# Patient Record
Sex: Female | Born: 1985 | Race: White | Hispanic: No
Health system: Midwestern US, Community
[De-identification: ages and names within clinical notes are randomized; demographics above are authoritative.]

## PROBLEM LIST (undated history)

## (undated) DIAGNOSIS — F1910 Other psychoactive substance abuse, uncomplicated: Secondary | ICD-10-CM

## (undated) DIAGNOSIS — F1410 Cocaine abuse, uncomplicated: Secondary | ICD-10-CM

## (undated) DIAGNOSIS — F32A Depression, unspecified: Secondary | ICD-10-CM

## (undated) DIAGNOSIS — Z5900 Homelessness unspecified: Secondary | ICD-10-CM

## (undated) DIAGNOSIS — I10 Essential (primary) hypertension: Secondary | ICD-10-CM

## (undated) DIAGNOSIS — G8929 Other chronic pain: Secondary | ICD-10-CM

## (undated) DIAGNOSIS — Z765 Malingerer [conscious simulation]: Secondary | ICD-10-CM

## (undated) DIAGNOSIS — J45909 Unspecified asthma, uncomplicated: Secondary | ICD-10-CM

## (undated) DIAGNOSIS — K219 Gastro-esophageal reflux disease without esophagitis: Secondary | ICD-10-CM

## (undated) DIAGNOSIS — N809 Endometriosis, unspecified: Secondary | ICD-10-CM

## (undated) DIAGNOSIS — F329 Major depressive disorder, single episode, unspecified: Secondary | ICD-10-CM

## (undated) DIAGNOSIS — R109 Unspecified abdominal pain: Secondary | ICD-10-CM

## (undated) HISTORY — PX: TONSILLECTOMY: SUR1361

---
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
## 2003-04-30 ENCOUNTER — Emergency Department (HOSPITAL_COMMUNITY): Admission: AD | Admit: 2003-04-30 | Discharge: 2003-04-30 | Payer: Self-pay | Admitting: Family Medicine

## 2003-08-17 ENCOUNTER — Ambulatory Visit (HOSPITAL_COMMUNITY): Admission: RE | Admit: 2003-08-17 | Discharge: 2003-08-17 | Payer: Self-pay | Admitting: Family Medicine

## 2003-09-08 ENCOUNTER — Other Ambulatory Visit: Admission: RE | Admit: 2003-09-08 | Discharge: 2003-09-08 | Payer: Self-pay | Admitting: Obstetrics and Gynecology

## 2004-03-05 ENCOUNTER — Inpatient Hospital Stay (HOSPITAL_COMMUNITY): Admission: AD | Admit: 2004-03-05 | Discharge: 2004-03-05 | Payer: Self-pay | Admitting: Obstetrics and Gynecology

## 2004-03-23 ENCOUNTER — Inpatient Hospital Stay (HOSPITAL_COMMUNITY): Admission: AD | Admit: 2004-03-23 | Discharge: 2004-03-26 | Payer: Self-pay | Admitting: Obstetrics and Gynecology

## 2004-04-28 ENCOUNTER — Other Ambulatory Visit: Admission: RE | Admit: 2004-04-28 | Discharge: 2004-04-28 | Payer: Self-pay | Admitting: Obstetrics and Gynecology

## 2006-08-18 ENCOUNTER — Inpatient Hospital Stay (HOSPITAL_COMMUNITY): Admission: AD | Admit: 2006-08-18 | Discharge: 2006-08-20 | Payer: Self-pay | Admitting: Obstetrics and Gynecology

## 2006-10-12 ENCOUNTER — Ambulatory Visit (HOSPITAL_COMMUNITY): Admission: RE | Admit: 2006-10-12 | Discharge: 2006-10-12 | Payer: Self-pay | Admitting: Obstetrics and Gynecology

## 2007-06-28 ENCOUNTER — Emergency Department (HOSPITAL_COMMUNITY): Admission: EM | Admit: 2007-06-28 | Discharge: 2007-06-28 | Payer: Self-pay | Admitting: Family Medicine

## 2007-07-11 HISTORY — PX: PARTIAL HYSTERECTOMY: SHX80

## 2008-01-01 ENCOUNTER — Emergency Department (HOSPITAL_COMMUNITY): Admission: EM | Admit: 2008-01-01 | Discharge: 2008-01-01 | Payer: Self-pay | Admitting: Emergency Medicine

## 2008-07-21 ENCOUNTER — Emergency Department (HOSPITAL_COMMUNITY): Admission: EM | Admit: 2008-07-21 | Discharge: 2008-07-21 | Payer: Self-pay | Admitting: Family Medicine

## 2008-08-10 LAB — CONVERTED CEMR LAB: Pap Smear: NORMAL

## 2008-08-25 ENCOUNTER — Ambulatory Visit (HOSPITAL_COMMUNITY): Admission: RE | Admit: 2008-08-25 | Discharge: 2008-08-25 | Payer: Self-pay | Admitting: Family Medicine

## 2008-09-25 ENCOUNTER — Encounter: Admission: RE | Admit: 2008-09-25 | Discharge: 2008-09-25 | Payer: Self-pay | Admitting: Obstetrics and Gynecology

## 2008-11-02 ENCOUNTER — Inpatient Hospital Stay (HOSPITAL_COMMUNITY): Admission: AD | Admit: 2008-11-02 | Discharge: 2008-11-02 | Payer: Self-pay | Admitting: Obstetrics and Gynecology

## 2008-11-03 ENCOUNTER — Inpatient Hospital Stay (HOSPITAL_COMMUNITY): Admission: EM | Admit: 2008-11-03 | Discharge: 2008-11-07 | Payer: Self-pay | Admitting: Emergency Medicine

## 2008-11-06 ENCOUNTER — Ambulatory Visit: Payer: Self-pay | Admitting: Gastroenterology

## 2008-11-14 ENCOUNTER — Inpatient Hospital Stay (HOSPITAL_COMMUNITY): Admission: AD | Admit: 2008-11-14 | Discharge: 2008-11-16 | Payer: Self-pay | Admitting: Obstetrics & Gynecology

## 2008-11-16 ENCOUNTER — Encounter: Payer: Self-pay | Admitting: Internal Medicine

## 2008-11-21 ENCOUNTER — Ambulatory Visit (HOSPITAL_COMMUNITY): Admission: AD | Admit: 2008-11-21 | Discharge: 2008-11-23 | Payer: Self-pay | Admitting: Obstetrics and Gynecology

## 2008-11-23 ENCOUNTER — Encounter (INDEPENDENT_AMBULATORY_CARE_PROVIDER_SITE_OTHER): Payer: Self-pay | Admitting: Obstetrics and Gynecology

## 2008-11-23 ENCOUNTER — Encounter (HOSPITAL_COMMUNITY): Payer: Self-pay | Admitting: Obstetrics and Gynecology

## 2008-12-18 ENCOUNTER — Ambulatory Visit (HOSPITAL_BASED_OUTPATIENT_CLINIC_OR_DEPARTMENT_OTHER): Admission: RE | Admit: 2008-12-18 | Discharge: 2008-12-18 | Payer: Self-pay | Admitting: Urology

## 2008-12-25 ENCOUNTER — Encounter: Admission: RE | Admit: 2008-12-25 | Discharge: 2008-12-25 | Payer: Self-pay | Admitting: General Surgery

## 2008-12-28 ENCOUNTER — Inpatient Hospital Stay (HOSPITAL_COMMUNITY): Admission: AD | Admit: 2008-12-28 | Discharge: 2008-12-28 | Payer: Self-pay | Admitting: Obstetrics and Gynecology

## 2009-04-22 ENCOUNTER — Encounter: Payer: Self-pay | Admitting: Family Medicine

## 2009-05-05 ENCOUNTER — Ambulatory Visit: Payer: Self-pay | Admitting: Family Medicine

## 2009-05-05 DIAGNOSIS — F329 Major depressive disorder, single episode, unspecified: Secondary | ICD-10-CM

## 2009-05-05 DIAGNOSIS — K219 Gastro-esophageal reflux disease without esophagitis: Secondary | ICD-10-CM | POA: Insufficient documentation

## 2009-05-05 DIAGNOSIS — F3289 Other specified depressive episodes: Secondary | ICD-10-CM | POA: Insufficient documentation

## 2009-05-05 DIAGNOSIS — R109 Unspecified abdominal pain: Secondary | ICD-10-CM | POA: Insufficient documentation

## 2009-05-05 DIAGNOSIS — I10 Essential (primary) hypertension: Secondary | ICD-10-CM | POA: Insufficient documentation

## 2009-05-07 ENCOUNTER — Telehealth: Payer: Self-pay | Admitting: Family Medicine

## 2009-05-17 ENCOUNTER — Emergency Department (HOSPITAL_COMMUNITY): Admission: EM | Admit: 2009-05-17 | Discharge: 2009-05-17 | Payer: Self-pay | Admitting: Emergency Medicine

## 2009-05-19 ENCOUNTER — Telehealth: Payer: Self-pay | Admitting: Family Medicine

## 2009-05-25 ENCOUNTER — Telehealth: Payer: Self-pay | Admitting: Family Medicine

## 2010-08-11 NOTE — Letter (Signed)
Summary: [facility]   Imported By: Maryln Gottron 01/05/2010 14:14:21  _____________________________________________________________________  External Attachment:    Type:   Image     Comment:   External Document

## 2010-10-12 LAB — POCT URINALYSIS DIP (DEVICE)
Nitrite: NEGATIVE
Protein, ur: NEGATIVE mg/dL
Urobilinogen, UA: 0.2 mg/dL (ref 0.0–1.0)
pH: 6 (ref 5.0–8.0)

## 2010-10-12 LAB — POCT PREGNANCY, URINE: Preg Test, Ur: NEGATIVE

## 2010-10-17 LAB — CBC
HCT: 34 % — ABNORMAL LOW (ref 36.0–46.0)
Hemoglobin: 11.7 g/dL — ABNORMAL LOW (ref 12.0–15.0)
MCHC: 34.5 g/dL (ref 30.0–36.0)
MCV: 92.8 fL (ref 78.0–100.0)
Platelets: 158 K/uL (ref 150–400)
RBC: 3.67 MIL/uL — ABNORMAL LOW (ref 3.87–5.11)
RDW: 12.8 % (ref 11.5–15.5)
WBC: 10.1 K/uL (ref 4.0–10.5)

## 2010-10-17 LAB — COMPREHENSIVE METABOLIC PANEL
ALT: 13 U/L (ref 0–35)
Albumin: 3.4 g/dL — ABNORMAL LOW (ref 3.5–5.2)
Alkaline Phosphatase: 53 U/L (ref 39–117)
Chloride: 107 mEq/L (ref 96–112)
Glucose, Bld: 102 mg/dL — ABNORMAL HIGH (ref 70–99)
Potassium: 4.5 mEq/L (ref 3.5–5.1)
Sodium: 139 mEq/L (ref 135–145)
Total Bilirubin: 0.7 mg/dL (ref 0.3–1.2)
Total Protein: 5.4 g/dL — ABNORMAL LOW (ref 6.0–8.3)

## 2010-10-17 LAB — GC/CHLAMYDIA PROBE AMP, GENITAL
Chlamydia, DNA Probe: NEGATIVE
GC Probe Amp, Genital: NEGATIVE

## 2010-10-17 LAB — URINALYSIS, ROUTINE W REFLEX MICROSCOPIC
Bilirubin Urine: NEGATIVE
Glucose, UA: NEGATIVE mg/dL
Hgb urine dipstick: NEGATIVE
Ketones, ur: NEGATIVE mg/dL
Nitrite: NEGATIVE
Protein, ur: NEGATIVE mg/dL
Specific Gravity, Urine: 1.025 (ref 1.005–1.030)
Urobilinogen, UA: 0.2 mg/dL (ref 0.0–1.0)
pH: 6 (ref 5.0–8.0)

## 2010-10-17 LAB — WET PREP, GENITAL
Clue Cells Wet Prep HPF POC: NONE SEEN
Trich, Wet Prep: NONE SEEN

## 2010-10-17 LAB — DIFFERENTIAL
Basophils Absolute: 0 K/uL (ref 0.0–0.1)
Basophils Relative: 0 % (ref 0–1)
Eosinophils Absolute: 0.1 K/uL (ref 0.0–0.7)
Eosinophils Relative: 1 % (ref 0–5)
Lymphocytes Relative: 25 % (ref 12–46)
Lymphs Abs: 2.5 K/uL (ref 0.7–4.0)
Monocytes Absolute: 0.9 K/uL (ref 0.1–1.0)
Monocytes Relative: 9 % (ref 3–12)
Neutro Abs: 6.6 K/uL (ref 1.7–7.7)
Neutrophils Relative %: 65 % (ref 43–77)

## 2010-10-17 LAB — POCT PREGNANCY, URINE: Preg Test, Ur: NEGATIVE

## 2010-10-17 LAB — POCT HEMOGLOBIN-HEMACUE: Hemoglobin: 14.3 g/dL (ref 12.0–15.0)

## 2010-10-18 LAB — GLUCOSE, CAPILLARY: Glucose-Capillary: 53 mg/dL — ABNORMAL LOW (ref 70–99)

## 2010-10-18 LAB — COMPREHENSIVE METABOLIC PANEL
ALT: 10 U/L (ref 0–35)
AST: 18 U/L (ref 0–37)
AST: 24 U/L (ref 0–37)
Albumin: 4.3 g/dL (ref 3.5–5.2)
BUN: 6 mg/dL (ref 6–23)
CO2: 28 mEq/L (ref 19–32)
Calcium: 8.5 mg/dL (ref 8.4–10.5)
Calcium: 9.4 mg/dL (ref 8.4–10.5)
Chloride: 99 mEq/L (ref 96–112)
Creatinine, Ser: 0.6 mg/dL (ref 0.4–1.2)
GFR calc Af Amer: >60 mL/min (ref >60)
GFR calc Af Amer: >60 mL/min (ref >60)
GFR calc non Af Amer: >60 mL/min (ref >60)
GFR calc non Af Amer: >60 mL/min (ref >60)
Sodium: 135 mEq/L (ref 135–145)

## 2010-10-18 LAB — CBC
HCT: 42.3 % (ref 36.0–46.0)
MCHC: 34.4 g/dL (ref 30.0–36.0)
MCHC: 35.2 g/dL (ref 30.0–36.0)
MCV: 93.4 fL (ref 78.0–100.0)
Platelets: 265 10*3/uL (ref 150–400)
RBC: 3.66 MIL/uL — ABNORMAL LOW (ref 3.87–5.11)
WBC: 8.2 10*3/uL (ref 4.0–10.5)

## 2010-10-18 LAB — URINALYSIS, ROUTINE W REFLEX MICROSCOPIC
Bilirubin Urine: NEGATIVE
Glucose, UA: 500 mg/dL — AB
Glucose, UA: NEGATIVE mg/dL
Hgb urine dipstick: NEGATIVE
Ketones, ur: NEGATIVE mg/dL
Ketones, ur: NEGATIVE mg/dL
Leukocytes, UA: NEGATIVE
Nitrite: NEGATIVE
Nitrite: NEGATIVE
Protein, ur: NEGATIVE mg/dL
Urobilinogen, UA: 0.2 mg/dL (ref 0.0–1.0)
pH: 7 (ref 5.0–8.0)

## 2010-10-18 LAB — BASIC METABOLIC PANEL
BUN: 4 mg/dL — ABNORMAL LOW (ref 6–23)
BUN: <1 mg/dL — ABNORMAL LOW (ref 6–23)
CO2: 25 mEq/L (ref 19–32)
CO2: 30 mEq/L (ref 19–32)
Calcium: 8.7 mg/dL (ref 8.4–10.5)
Chloride: 106 mEq/L (ref 96–112)
Creatinine, Ser: 0.5 mg/dL (ref 0.4–1.2)
Creatinine, Ser: 0.45 mg/dL (ref 0.4–1.2)
Glucose, Bld: 84 mg/dL (ref 70–99)

## 2010-10-18 LAB — URINE MICROSCOPIC-ADD ON

## 2010-10-18 LAB — SEDIMENTATION RATE: Sed Rate: 13 mm/hr (ref 0–22)

## 2010-10-18 LAB — HCG, SERUM, QUALITATIVE: Preg, Serum: NEGATIVE

## 2010-10-18 LAB — DIFFERENTIAL
Basophils Absolute: 0.1 10*3/uL (ref 0.0–0.1)
Eosinophils Relative: 0 % (ref 0–5)
Lymphocytes Relative: 15 % (ref 12–46)
Lymphs Abs: 1.5 10*3/uL (ref 0.7–4.0)
Monocytes Absolute: 0.4 10*3/uL (ref 0.1–1.0)
Neutro Abs: 8.3 10*3/uL — ABNORMAL HIGH (ref 1.7–7.7)

## 2010-10-18 LAB — HEMOGLOBIN A1C
Hgb A1c MFr Bld: 5.2 % (ref 4.6–6.1)
Mean Plasma Glucose: 103 mg/dL

## 2010-10-18 LAB — LIPASE, BLOOD: Lipase: 23 U/L (ref 11–59)

## 2010-10-19 LAB — BASIC METABOLIC PANEL
BUN: 7 mg/dL (ref 6–23)
BUN: 8 mg/dL (ref 6–23)
CO2: 25 mEq/L (ref 19–32)
Calcium: 8.3 mg/dL — ABNORMAL LOW (ref 8.4–10.5)
Chloride: 102 mEq/L (ref 96–112)
Chloride: 108 mEq/L (ref 96–112)
GFR calc Af Amer: >60 mL/min (ref >60)
GFR calc non Af Amer: >60 mL/min (ref >60)
Glucose, Bld: 105 mg/dL — ABNORMAL HIGH (ref 70–99)
Glucose, Bld: 82 mg/dL (ref 70–99)
Potassium: 3 mEq/L — ABNORMAL LOW (ref 3.5–5.1)
Sodium: 136 mEq/L (ref 135–145)
Sodium: 140 mEq/L (ref 135–145)

## 2010-10-19 LAB — CBC
HCT: 42.1 % (ref 36.0–46.0)
Hemoglobin: 11.3 g/dL — ABNORMAL LOW (ref 12.0–15.0)
Hemoglobin: 11.8 g/dL — ABNORMAL LOW (ref 12.0–15.0)
Hemoglobin: 14.3 g/dL (ref 12.0–15.0)
MCHC: 33.9 g/dL (ref 30.0–36.0)
MCHC: 34.7 g/dL (ref 30.0–36.0)
MCV: 92.1 fL (ref 78.0–100.0)
Platelets: 144 10*3/uL — ABNORMAL LOW (ref 150–400)
RBC: 3.53 MIL/uL — ABNORMAL LOW (ref 3.87–5.11)
RBC: 4.6 MIL/uL (ref 3.87–5.11)
RDW: 12.4 % (ref 11.5–15.5)
RDW: 12.5 % (ref 11.5–15.5)

## 2010-10-19 LAB — COMPREHENSIVE METABOLIC PANEL
ALT: 18 U/L (ref 0–35)
Alkaline Phosphatase: 71 U/L (ref 39–117)
BUN: 13 mg/dL (ref 6–23)
CO2: 22 mEq/L (ref 19–32)
Calcium: 9.9 mg/dL (ref 8.4–10.5)
GFR calc non Af Amer: >60 mL/min (ref >60)
Glucose, Bld: 142 mg/dL — ABNORMAL HIGH (ref 70–99)
Sodium: 139 mEq/L (ref 135–145)

## 2010-10-19 LAB — HEPATIC FUNCTION PANEL
AST: 15 U/L (ref 0–37)
Albumin: 3.5 g/dL (ref 3.5–5.2)

## 2010-10-19 LAB — DIFFERENTIAL
Basophils Relative: 1 % (ref 0–1)
Eosinophils Absolute: 0 10*3/uL (ref 0.0–0.7)
Eosinophils Relative: 0 % (ref 0–5)
Monocytes Absolute: 0.5 10*3/uL (ref 0.1–1.0)
Monocytes Relative: 4 % (ref 3–12)
Neutro Abs: 12 10*3/uL — ABNORMAL HIGH (ref 1.7–7.7)

## 2010-10-19 LAB — URINALYSIS, ROUTINE W REFLEX MICROSCOPIC
Glucose, UA: NEGATIVE mg/dL
Hgb urine dipstick: NEGATIVE
Hgb urine dipstick: NEGATIVE
Ketones, ur: 40 mg/dL — AB
Nitrite: NEGATIVE
Nitrite: NEGATIVE
Specific Gravity, Urine: 1.027 (ref 1.005–1.030)
Specific Gravity, Urine: 1.02 (ref 1.005–1.030)
pH: 8 (ref 5.0–8.0)
pH: 6 (ref 5.0–8.0)

## 2010-10-19 LAB — WET PREP, GENITAL: Trich, Wet Prep: NONE SEEN

## 2010-10-19 LAB — URINE MICROSCOPIC-ADD ON

## 2010-10-19 LAB — URINE CULTURE

## 2010-10-19 LAB — GC/CHLAMYDIA PROBE AMP, GENITAL
Chlamydia, DNA Probe: NEGATIVE
GC Probe Amp, Genital: NEGATIVE

## 2010-10-19 LAB — RPR: RPR Ser Ql: NONREACTIVE

## 2010-10-19 LAB — POCT PREGNANCY, URINE: Preg Test, Ur: NEGATIVE

## 2010-10-19 LAB — MAGNESIUM: Magnesium: 1.8 mg/dL (ref 1.5–2.5)

## 2010-11-22 NOTE — Op Note (Signed)
[name], [name]              ACCOUNT NO.:  [id number]   MEDICAL RECORD NO.:  [id number]          PATIENT TYPE:  AMB   LOCATION:  [facility]                         FACILITY:  [facility]   PHYSICIAN:  Bertram Millard. Dahlstedt, M.D.DATE OF BIRTH:  June 24, 1986   DATE OF PROCEDURE:  12/18/2008  DATE OF DISCHARGE:                               OPERATIVE REPORT   PREOPERATIVE DIAGNOSIS:  Possible interstitial cystitis.   POSTOPERATIVE DIAGNOSIS:  Possible interstitial cystitis.   PRINCIPAL PROCEDURE:  Cysto, hydrodistention of bladder, placement of  Pyridium and Marcaine.   SURGEON:  Bertram Millard. Dahlstedt, M.D.   ANESTHESIA:  General with LMA.   COMPLICATIONS:  None.   BRIEF HISTORY:  This 25 year old woman has been seen by me twice before  in the office, first in May and later about a week ago.  She has  multiple somatic complaints, voiding dysfunction, urinary frequency,  bladder and pelvic pain.  She presents at this time for cysto and HOD to  rule out interstitial cystitis.  She is aware of risks and complications  and desires to proceed.   DESCRIPTION OF PROCEDURE:  The patient was administered preoperative IV  antibiotics.  She was taken to the operating room where general  anesthetic was administered using the LMA.  She was placed in the dorsal  lithotomy position, genitalia and perineum were prepped and draped.  Time-out was then called.  Procedure then commenced.  A 22 French  panendoscope was placed in the bladder.  Cystoscopically, her bladder  was normal.  There were no lesions, no tumors or trabeculations.  Ureteral orifices were normal in configuration.  The bladder was filled  to capacity with the water approximately 700 mm above the bladder.  Once  the irrigation stopped flowing in, the bladder was left distended for 10  minutes.  Following this, the bladder was drained of 800 mL of water.  Reinspection of the bladder revealed no significant glomerulations.  Slight increased  vascular appearance compared to pre hydrodistention.  There was no bleeding.  Peridium and Marcaine were then instilled in the  bladder.  A B and O suppository was placed.  The patient tolerated the  procedure well.  She was awakened and taken to the PACU in stable  condition.   She was discharged on 30 separate Percocet 10/325 - she had received  approximately 40 about a week ago.  She was told that this will be her  last refill until I follow her up on July 2.  She was also discharged on  5 days of Cipro 250 mg one p.o. b.i.d.  She was also given more samples  of Gelnique.      Bertram Millard. Dahlstedt, M.D.  Electronically Signed     SMD/MEDQ  D:  12/18/2008  T:  12/18/2008  Job:  [id number]   cc:   Guy Sandifer. Henderson Cloud, M.D.  Fax: [phone]   Sherin Quarry, MD

## 2010-11-22 NOTE — Discharge Summary (Signed)
NAMEBRANDIS, [name]           ACCOUNT NO.:  [id number]   MEDICAL RECORD NO.:  [id number]          PATIENT TYPE:  INP   LOCATION:  9303                          FACILITY:  [facility]   PHYSICIAN:  Guy Sandifer. Henderson Cloud, M.D. DATE OF BIRTH:  1986/03/21   DATE OF ADMISSION:  11/13/2008  DATE OF DISCHARGE:  11/16/2008                               DISCHARGE SUMMARY   ADMITTING DIAGNOSES:  1. Abdominopelvic pain.  2. Persistent nausea, vomiting.  3. Bloody stools.  4. Urinary frequency.  5. Dysmenorrhea and menorrhagia.   DISCHARGE DIAGNOSES:  1. Abdominopelvic pain.  2. Internal hemorrhoids.  3. Nausea, vomiting, resolved.  4. Urinary frequency.  5. Dysmenorrhea and menorrhagia.   REASON FOR ADMISSION:  This patient is a 25 year old married white  female, status post tubal ligation with a 1- to 2-year history of  painful sometimes heavy menses.  She was seen in the office in the last  2 weeks with complaints of increasing pain with her menses and  generalized abdominopelvic pain.  She also complained of urinary  frequency and breast soreness, so an histogram in the office revealed an  uterus consistent with adenomyosis and a negative endometrial cavity.  There is a 2-cm follicle on her ovary and otherwise normal findings.  The patient was referred to Urology as well as General Surgery for  breast complaints.  Her initial evaluation with the General Surgeon was  consistent with probable fibrocystic breast change.  Urology  consultation is pending.  She had an increased pain as well as  persistent nausea, vomiting, which led to admission to [facility] approximately 1 week ago.  She was given intravenous fluids.  GI consultation was obtained, although no procedures were done at that  time.  She was discharged to home after approximately 2-3 days.  She  then had a recurrence of persistent nausea, vomiting, and abdominal  pelvic pain, which led to the admission on Nov 13, 2008.   HOSPITAL COURSE:  On admission, the patient was noted to have stable  vital signs and was afebrile.  She was given intravenous fluids and  antiemetics.  She was started on PCA Dilaudid and GI consultation was  obtained.  Abdominal x-rays on Nov 13, 2008, revealed a normal gas  pattern.  Ultrasound on Nov 13, 2008, of the pelvis was normal with a 2.8-  cm simple left ovarian cyst consistent with a dominant follicle again  noted.  There was no free fluid.  The patient has had 2 abdominopelvic  CAT scans in the last 2-4 weeks consistent with the above findings as  well.  She was started on a bland diet and maintained on the IV  Dilaudid.  On the morning of Nov 16, 2008, she was taken to [facility] and underwent colonoscopy.  There she was noted to have normal  findings with the exception of internal hemorrhoids.  There was no GI  cause for pain found or suspected.  The patient has been taking oral  Dilaudid as needed this afternoon.  She is tolerating a regular diet and  smiling.  I carefully discussed  with the patient and her husband the  fact that what we know, she is sometimes has heavy painful periods.  There may very well be other factors involved in her symptoms.  It is  important to investigate this fully.  She agrees.   LABORATORY DATA:  On Nov 13, 2008, hemoglobin 11.9, white count 8.2.  On  Nov 13, 2008, potassium is 3.2 and a repeat on Nov 15, 2008, is normal at  5.1.  Hemoglobin A1c was normal.  Liver functions were normal.  Calcium  is normal.  Urinalysis was normal and sed rate was normal at 13.   RADIOLOGY FINDINGS:  As above.   CONDITION ON DISCHARGE:  Good.   DIET:  Regular as tolerated.   ACTIVITY:  Rest, no heavy lifting.  Sedation warnings given secondary to  narcotics.   MEDICATIONS:  1. Dilaudid 2 mg #40 one p.o. q.4-6 h. p.r.n.  2. Zofran 8 mg ODT #10 one p.o. q.8 h. p.r.n. nausea, vomiting with 1      refill.  3. She will resume Protonix 40 mg daily.   4. She will continue Zoloft daily at her regular dose.   DISCHARGE INSTRUCTIONS:  She will call the office for problems including  but not limited to increasing pain, persistent nausea, vomiting,  temperature of 101 degrees.  Follow up with Urology consultation  scheduled for Nov 19, 2008, the importance of this was reviewed with the  patient.  Follow up with General Surgery for breast findings on Nov 26, 2008, scheduled.  Referral to pain management in [place] is  underway.      Guy Sandifer Henderson Cloud, M.D.  Electronically Signed     JET/MEDQ  D:  11/16/2008  T:  11/17/2008  Job:  [id number]
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
## 2010-11-22 NOTE — Op Note (Signed)
[name], [name]           ACCOUNT NO.:  [id number]   MEDICAL RECORD NO.:  [id number]          PATIENT TYPE:  INP   LOCATION:  9318                          FACILITY:  [facility]   PHYSICIAN:  Guy Sandifer. Henderson Cloud, M.D. DATE OF BIRTH:  [birth year]   DATE OF PROCEDURE:  11/23/2008  DATE OF DISCHARGE:  11/23/2008                               OPERATIVE REPORT   PREOPERATIVE DIAGNOSES:  1. Pelvic pain.  2. Menorrhagia.   POSTOPERATIVE DIAGNOSES:  1. Pelvic pain.  2. Menorrhagia.  3. Left ovarian cyst.   PROCEDURES:  1. NovaSure endometrial ablation.  2. Hysteroscopy.  3. Dilatation and curettage.  4. Laparoscopy with removal of bilateral Filshie clips.  5. Aspiration of left ovarian cyst.   SURGEON:  Guy Sandifer. Henderson Cloud, MD   ANESTHESIA:  General with endotracheal intubation.   SPECIMENS:  1. Endometrial curettings.  2. Bilateral Filshie clips.  3. Aspirate of left ovarian cyst, all to pathology.   ESTIMATED BLOOD LOSS:  Minimal.   INTRAVENOUS FLUIDS:  I's and O's of distending media 105 mL deficit with  some of that on the floor.   INDICATIONS AND CONSENT:  This patient is a 25 year old married white  female status post tubal ligation who has increasing pelvic abdominal  pain with nausea and vomiting.  Her pain has become increasingly worse  resulting in 3 hospital admissions over the last few weeks.  Abdominopelvic CAT scan, pelvic ultrasound, sonohysterogram,  colonoscopy, have all been negative with the exception of a small left  ovarian cyst.  The patient has had this pain over the last several  months to a year or more.  She complains of frequent spontaneous onset  of pain in the lower abdomen and the bilateral lower quadrants which was  followed by intractable nausea and vomiting.  She has been admitted  subsequent to these episodes, treated with IV fluids, antiemetics, and  PCA narcotics.  She has seen GI on 2 occasions with the negative  colonoscopy as above.  She  recently saw the urologist who diagnosed her  with overactive bladder.  She was placed on medication for this and  given diet recommendations, but had the onset of her pain, recurrence of  nausea and vomiting the day or 2 later and was subsequently admitted.  She has seen Orthopedics for diagnosis and treatment of low back  problems.  She has an appointment pending with the pelvic pain  specialist in [place] in the end of June.  Finally, she has  increasingly heavy menses.  I carefully discussed with the patient and  her husband on several occasions the above issues.  The fact that her  pain and nausea, vomiting may very well be multifactorial has been  discussed and acknowledged by the patient.  An effort to carefully  proceed and evaluate and treat the known GYN issues and after  consideration of options, hysteroscopy, D&C with endometrial ablation,  and laparoscopy have been discussed.  Potential risks and complications  were discussed preoperatively including but limited to infection, organ  damage, bleeding requiring transfusion of blood products with HIV and  hepatitis acquisition, DVT,  PE, pneumonia, hysterectomy, laparotomy.  Possible negative laparoscopy was discussed.  Recurrence or continuation  of pain and/or dyspareunia was also discussed.  Success and failure  rates of the ablation was covered as well.  At the request of the  patient, these issues were also discussed with the patient's husband on  the telephone preoperatively.  All questions were answered and consent  is signed on the chart.   FINDINGS:  The uterine cavity, both fallopian tube, and ostia are  identified.  No abnormal structures were noted.  Abdominally, the upper  abdomen appears normal.  The appendix is normal.  In the pelvis, the  anterior and posterior cul-de-sacs after careful examination are normal.  The Filshie clips were noted to be attached to the mesosalpinx  bilaterally with the 2 ends of the  fallopian tubes separated status post  ligation.  There were hanging by a very thin portion of the mesosalpinx.  Right ovary is normal.  Left ovary contains approximately 3-cm smooth  translucent cyst.  There were no adhesions bilaterally.  Careful  inspection of the pelvic sidewalls also reveals them to be normal.   PROCEDURE:  The patient was taken to the operating room where she was  identified, placed in dorsal supine position, and general anesthesia was  induced via endotracheal intubation.  She was then placed in the dorsal  lithotomy position where she was prepped abdominally and vaginally.  Bladder was straight catheterized and she was draped in a sterile  fashion.  Time-out was undertaken.  She does receive preoperative  antibiotics.  Bivalve speculum was placed in the vagina and the anterior  cervical lip was injected with 1% plain Xylocaine.  It was then grasped  with a single-tooth tenaculum.  Paracervical block was placed at 2, 4,  5, 7, and 10 o'clock positions with approximately 20 mL of the same  solution.  Uterus sounds to 8 cm and the cervix sounds to 4 cm.  Cervix  was gently and progressively dilated.  Diagnostic hysteroscope was  placed in the endocervical canal, advanced under direct visualization.  The above findings were noted.  Hysteroscope was withdrawn.  Sharp  curettage was carried out.  The NovaSure endometrial device was then  placed without difficulty.  Cavity test was passed on the first attempt.  Cavity width was 4 cm.  Ablation was then carried out without  difficulty.  The device was removed and inspection reveals it to be  intact.  Hysteroscope was again placed in the endocervical canal and  advanced under direct visualization with distending media.  Good cautery  effect was noted throughout the cavity up into the fornices.  No  evidence of perforation was noted.  Hysteroscope was withdrawn.  Hulka  tenaculum was used to replace the single-tooth  tenaculum and attention  was turned to the abdomen.  The infraumbilical and suprapubic areas were  injected in the midline with 1% plain Xylocaine for 5 mL total.  Small  infraumbilical incision was made and a disposable Veress needle was  placed on the first attempt without difficulty.  A good syringe and drop  test were noted.  2 L of gas were then insufflated under low pressure  with good tympany in the right upper quadrant.  Veress needle was then  removed.  A 10/11 Xcel Bladeless disposable trocar sleeve was placed  using direct visualization with the diagnostic laparoscope.  After  placement the operative laparoscope was used.  A small suprapubic  incision was made well  above the bladder and a 5-mm Xcel Bladeless  disposable trocar sleeve was placed under direct visualization without  difficulty.  The above findings were noted.  The Filshie clips were  removed without difficulty.  Good hemostasis was noted.  The irrigation  was carried out and removed with a 60 mL syringe.  The left ovarian cyst  was then aspirated for bloody serous fluid.  The point of aspiration on  the ovary was briefly cauterized with bipolar cautery.  After noting  good hemostasis all around, 10 mL of 1% Xylocaine were instilled into  the peritoneal cavity.  Suprapubic trocar sleeve was removed and no  bleeding was noted.  Pneumoperitoneum was completely reduced and the  umbilical trocar sleeve was removed.  A 0 Vicryl suture was used to  reapproximate the fascia with a single stitch under good visualization  of the umbilical incision.  The skin and the umbilicus was closed with  subcuticular 3-0 Vicryl suture.  Both same sutures were used in  subcuticular stitch on the suprapubic incision.  Dermabond was placed on  both incisions.  Hulka tenaculum was removed and no bleeding was noted.  All counts were correct.  The patient was awakened and taken to recovery  room in stable condition.      Guy Sandifer  Henderson Cloud, M.D.  Electronically Signed     JET/MEDQ  D:  11/23/2008  T:  11/24/2008  Job:  [id number]

## 2010-11-22 NOTE — H&P (Signed)
NAMEBRYNLYN, [name]              ACCOUNT NO.:  [id number]   MEDICAL RECORD NO.:  [id number]          PATIENT TYPE:  INP   LOCATION:  1823                         FACILITY:  [facility]   PHYSICIAN:  Acey Lav, MD  DATE OF BIRTH:  15-Aug-1985   DATE OF ADMISSION:  11/03/2008  DATE OF DISCHARGE:                              HISTORY & PHYSICAL   PRIMARY CARE PHYSICIAN:  The patient has no primary care physician.   CHIEF COMPLAINT:  Intractable nausea and vomiting.   HISTORY OF PRESENT ILLNESS:  Ms. Kruck is a 25 year old Caucasian  female with a past medical history significant for tubal ligation, prior  workup for abdominal pain nearly a year ago in June 2009 for lower  abdominal pain, nausea and vomiting in the emergency department.  She  has now had onset approximately 1 month ago of lower abdominal pain,  which has been worked up by her gynecologist.  She developed acute onset  yesterday of intractable nausea and vomiting with bilious vomitus.  She  lives with her husband and his 2 children, as well as her mother and her  mother's boyfriend.  The 2 children have apparently just now developed  some diarrhea, according to the patient's mother.  Otherwise, she has  had no sick contacts.  She has not ingested any seafood recently.  She  has not eaten anywhere out of the ordinary.  She was seen in the  emergency department yesterday and had a transvaginal ultrasound done,  which was normal.  She was seen in the emergency department again today  for intractable nausea and vomiting, and had a CT of the abdomen done  with IV, but no oral contrast.  This showed bilateral ovarian follicles  and a left ovarian cyst, but no other intrapelvic abnormalities and the  abdomen was without any intra-abdominal pathology.  She has also had  subjective fevers.   LABS ON ADMISSION:  Pertinent for leukocytosis of 13,900 with an ANC of  12,000.  Comprehensive metabolic panel pertinent for potassium  3.2,  glucose 142, creatinine 0.73.  Lipase was negative.  Urine pregnancy  test was negative.  Wet prep was negative, other than a few white cells  having been seen.  Urinalysis concentrated with a specific gravity of  1.027, small amount of leukocytes, nitrite negative.   ED COURSE:  The patient has been given antiemetics and IV fluids in the  emergency department but her vomiting has persisted.   PAST MEDICAL HISTORY:  1. Depression.  2. History of tubal ligation.  3. Also history of asthma.   SURGICAL HISTORY:  1. History of tonsillectomy.  2. History of tubal ligation.   FAMILY HISTORY:  Not much known about her mother's family history and  she does not know her father.   SOCIAL HISTORY:  The patient smokes 2 packs per day of cigarettes.  Rare  alcohol.  Denies recreational drug use.   REVIEW OF SYSTEMS:  Pertinent for nausea and vomiting with subjective  chills for the last 24 hours.  Also, pertinent for chronic lower  abdominal pain.  Otherwise, 10-point review  of systems is pertinent for  some depression, but the patient denies any suicidal ideation or  homicidal ideation.  She has stress related to the relationship with her  mother.   PHYSICAL EXAMINATION:  VITAL SIGNS:  Blood pressure in the emergency  room is 146/84, pulse 83, respirations 20, pulse ox 98% on room air, T-  max 97.2.  GENERAL:  The patient is sitting in bed, vomiting bilious vomit, rocking  back and forth, and complaining of lower abdominal pain.  The patient is  disheveled, uncomfortable appearing.  HEENT:  Her eyes are somewhat bloodshot.  Oropharynx dry.  CARDIOVASCULAR:  Tachycardic.  No murmur, gallop, or rub.  LUNGS:  Clear to auscultation bilaterally without wheeze, rales, or  rhonchi.  ABDOMEN:  Soft and nondistended but tender to palpation in the lower  quadrants without rebound.  She had some CVA tenderness on the right,  although was a little bit out of proportion on exam.  LOWER  EXTREMITIES:  Without edema.   LABORATORY DATA:  CT scan as described above.  Urinalysis is 3-6 white  blood cells, few bacteria, concentrated urine, specific gravity 1.027.  Small leukocytes, positive ketones.  Pregnancy test is negative.  Wet  prep negative other than a few white cells.  Lipase negative.  CMP,  sodium 139, potassium 3.2, chloride 103, bicarb 20, BUN and creatinine  13 and 0.73, glucose 142, total bili 0.9, alkaline phosphatase 71, AST  and ALT 27 and 18.  Albumin 4.8.  CBC with differential white count is  13.9, hemoglobin 14.3, platelets 229,000.  ANC 12.   ASSESSMENT AND PLAN:  This is a 25 year old Caucasian female with past  medical history significant for chronic lower abdominal pain evaluated a  year ago and now with onset of abdominal pain x1 month with acute  worsening of nausea and vomiting that has been intractable the last 24  hours and has had recalcitrant to antiemetics in the emergency  department.  Her CT of the abdomen and pelvis with IV contrast was  unrevealing, as was her transvaginal ultrasound.   PROBLEM LIST:  1. Intractable nausea and vomiting:  Most likely, this patient has a      viral gastroenteritis given her two  children with diarrhea.  She      could have a urinary tract infection( She has a few white cells in      her urine and maybe some lower quadrant pain) but I am a little      skeptical of this (she has no ssx of dysuria and Ua yesterday was      negative) but I will go ahead and give a dose of Rocephin, provided      that we get some urine cultures cooking.  I will give her Zofran,      Phenergan, and Ativan for nausea, and give her IV fluids and      replete her potassium.  2. Healthcare screening:  I will check hepatitis panel as well as HIV.  3. I will check a hemoglobin A1c as well.  4. Prophylaxis:  The patient will be on heparin x1 daily.  5. Code Status:  The patient is Full Code.      Acey Lav, MD   Electronically Signed     CV/MEDQ  D:  11/03/2008  T:  11/03/2008  Job:  [id number]

## 2010-11-25 NOTE — Op Note (Signed)
[name], [name]              ACCOUNT NO.:  [id number]   MEDICAL RECORD NO.:  [id number]          PATIENT TYPE:  AMB   LOCATION:  SDC                           FACILITY:  [facility]   PHYSICIAN:  Guy Sandifer. Henderson Cloud, M.D. DATE OF BIRTH:  [birth year]   DATE OF PROCEDURE:  10/12/2006  DATE OF DISCHARGE:                               OPERATIVE REPORT   PREOPERATIVE DIAGNOSIS:  Desires permanent sterilization.   POSTOPERATIVE DIAGNOSIS:  Desires permanent sterilization.   PROCEDURE:  Laparoscopy with bilateral tubal ligation with Filshie  clips.   SURGEON:  Harold Hedge, M.D.   ANESTHESIA:  General with endotracheal intubation.   ESTIMATED BLOOD LOSS:  Drops.   INDICATIONS AND CONSENT:  This patient is 25 year old white female G2,  P2 who desires permanent sterilization.  Alternative methods of  contraception have been discussed.  Potential risks and complications  have been discussed preoperatively including but not limited to  infection, bowel, bladder, ureteral damage, bleeding requiring  transfusion of blood products with possible transfusion reaction, HIV  and hepatitis acquisition, DVT, PE and pneumonia.  Permanence of the  procedure, increased ectopic risk and failure rate have been reviewed.  All questions have been answered, and consent is signed on the chart.   FINDINGS:  Upper abdomen is grossly normal.  Uterus is normal in size  and contour.  Tubes and ovaries are normal.   PROCEDURE:  The patient was taken to operating room where she was  identified, placed in dorsal supine position and general anesthesia was  induced with endotracheal intubation.  She was then placed in the dorsal  lithotomy position where she was prepped abdominally and vaginally,  bladder straight catheterized.  Hulka tenaculum was placed.  Uterus was  manipulated, and she was draped in a sterile fashion.  The  infraumbilical and suprapubic areas were injected in the midline with  0.5% plain  Marcaine.  A small infraumbilical incision was made, and a  disposable Veress needle was placed on first attempt without difficulty.  A normal syringe and drop test were noted.  Two liters of gas were  insufflated under low pressure with good tympany in the right upper  quadrant.  Veress needle was removed, and a 10/11 XL bladeless  disposable trocar sleeve was placed under direct visualization using the  diagnostic laparoscopy.  After placement, this was switched to the  operative laparoscopic.  A small suprapubic incision was made in the  midline, and a 5-mm bladeless XL disposable trocar sleeve was placed  under direct visualization without difficulty.  The above findings were  noted.  The right fallopian tube was identified from cornu to fimbria.  Filshie clip was placed in the proximal 1/3 of the tube.  Similar  procedure was carried out on the left tube.  The Filshie clip applicator  was then removed, and the inspection revealed the entire width of the  tube to be within the clip and heal of the clip to be visualized through  the mesosalpinx bilaterally.  Good hemostasis was noted.  Suprapubic  trocar sleeve was removed, and good hemostasis was noted there as  well.  Pneumoperitoneum was  reduced.  The umbilical trocar sleeve was removed.  Skin on the  umbilical incision was closed with interrupted 2-0 Vicryl suture.  Dermabond was placed on both incisions.  Hulka tenaculum was removed,  and no bleeding was noted.  All counts were correct.  The patient was  awakened, taken to the recovery room in stable condition.      Guy Sandifer Henderson Cloud, M.D.  Electronically Signed     JET/MEDQ  D:  10/12/2006  T:  10/12/2006  Job:  [id number]

## 2010-11-25 NOTE — H&P (Signed)
[name], [name]              ACCOUNT NO.:  [id number]   MEDICAL RECORD NO.:  [id number]          PATIENT TYPE:  AMB   LOCATION:  SDC                           FACILITY:  [facility]   PHYSICIAN:  Guy Sandifer. Henderson Cloud, M.D. DATE OF BIRTH:  [birth year]   DATE OF ADMISSION:  10/12/2006  DATE OF DISCHARGE:                              HISTORY & PHYSICAL   CHIEF COMPLAINT:  Desires permanent sterilization.   HISTORY OF PRESENT ILLNESS:  This patient is a 25 year old white female  G2 P2 who desires permanent sterilization.  Alternate methods of  contraception have been discussed.  Permanence, failure rate and  increased ectopic risk and potential complications of tubal ligation  have all been discussed.  The patient would like to proceed with  permanent sterilization.   PAST MEDICAL HISTORY:  Asthma.   PAST SURGICAL HISTORY:  Patient did have a tonsillectomy at age 57.   OBSTETRICAL HISTORY:  Vaginal delivery x2.   SOCIAL HISTORY:  The patient is a tobacco smoker; denies alcohol or drug  abuse.   FAMILY HISTORY:  Coronary artery disease in maternal grandmother;  chronic hypertension in sister; breast cancer maternal grandmother;  cervical cancer in sister; diabetes maternal grandfather and maternal  grandmother; mental retardation in cousin.   MEDICATIONS:  1. Albuterol inhaler p.r.n.  2. Advair Diskus p.r.n.   ALLERGIES:  NO KNOWN DRUG ALLERGIES.   REVIEW OF SYSTEMS:  NEURO: Denies headache.  CARDIOLOGY: Denies chest  pain.  PULMONARY: Denies shortness of breath.  GI: Denies recent changes  in bowel habits.   PHYSICAL EXAMINATION:  VITAL SIGNS:  Height 5 feet 8 inches, weight  159.2 pounds, blood pressure 120/72.  HEENT:  Without thyromegaly.  LUNGS:  Clear to auscultation.  HEART:  Regular rate and rhythm.  BACK:  Without CVA tenderness.  BREASTS:  The patient is breast feeding.  ABDOMEN:  Soft, nontender, without masses.  PELVIC EXAM:  Vulva, vagina, cervix without lesion.   Uterus is upper  normal size, mobile, nontender.  Adnexa nontender without masses.  EXTREMITIES:  Grossly within normal limits.  NEUROLOGICAL:  Exam grossly within normal limits.   ASSESSMENT:  Multiparous patient desires permanent sterilization.   PLAN:  Laparoscopy with tubal ligation with Filshie clips.      Guy Sandifer Henderson Cloud, M.D.  Electronically Signed     JET/MEDQ  D:  10/10/2006  T:  10/10/2006  Job:  [id number]

## 2010-11-26 IMAGING — CR DG SACRUM/COCCYX 2+V
3 series · 3 of 3 positions shown · non-contrast
Comparison: 01/01/2008 study

CLINICAL DATA: History of low back pain

SACRUM AND COCCYX - 2+ VIEW

[t sacrum a.p.]
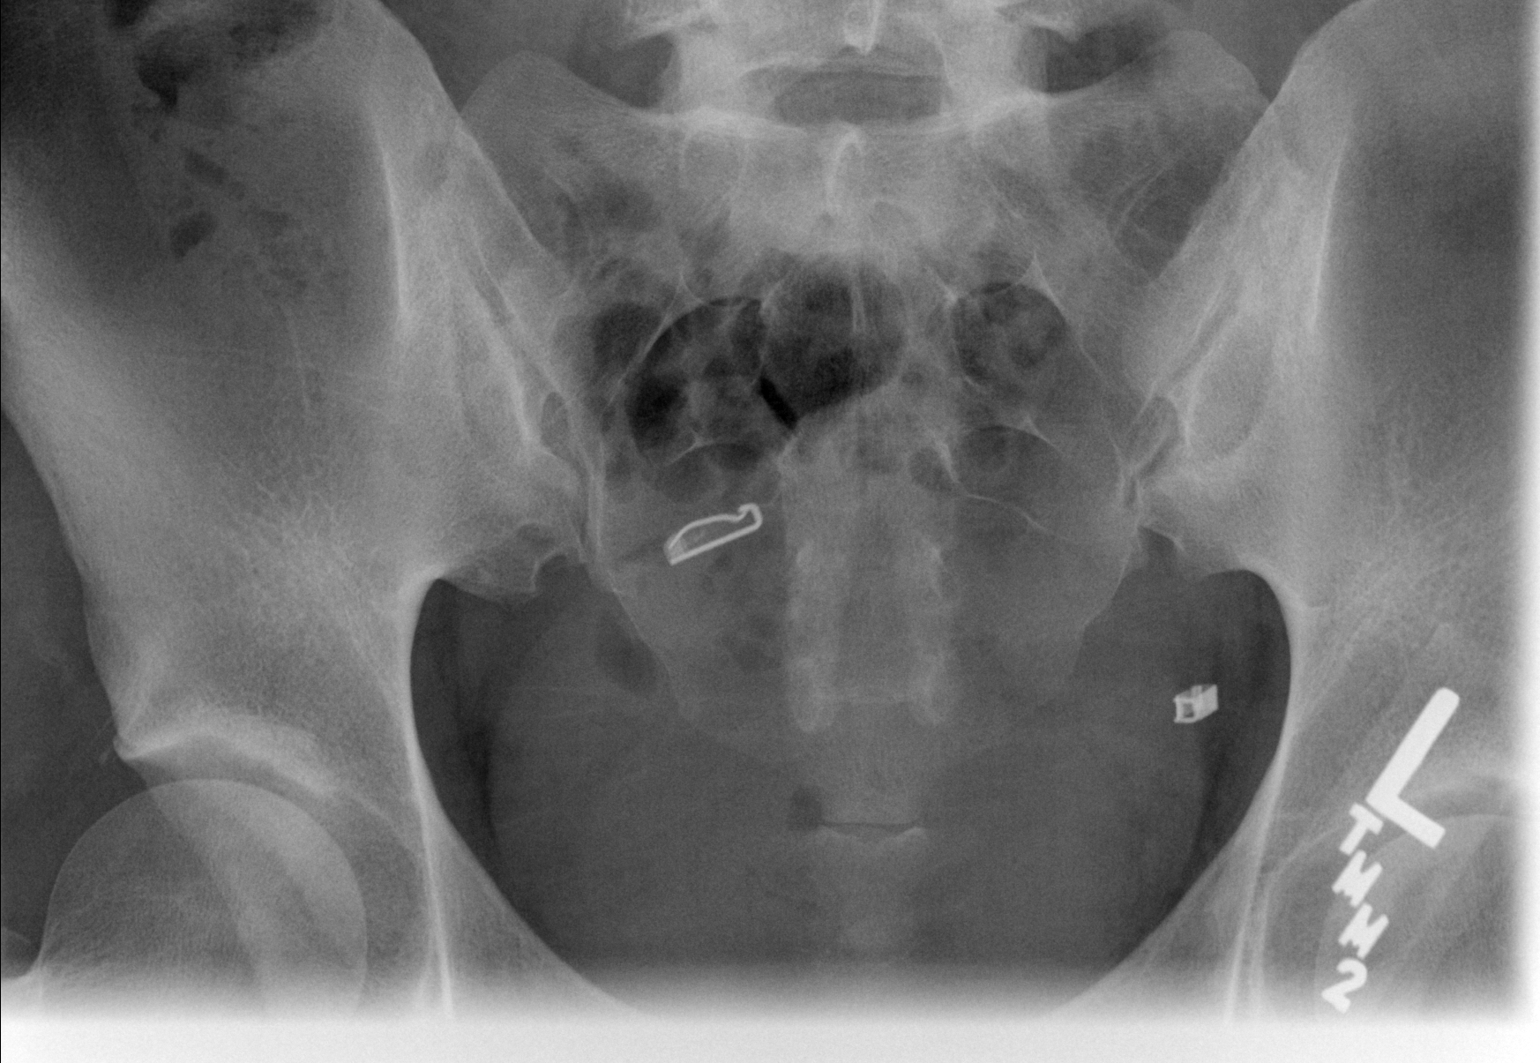

[t coccyx a.p.]
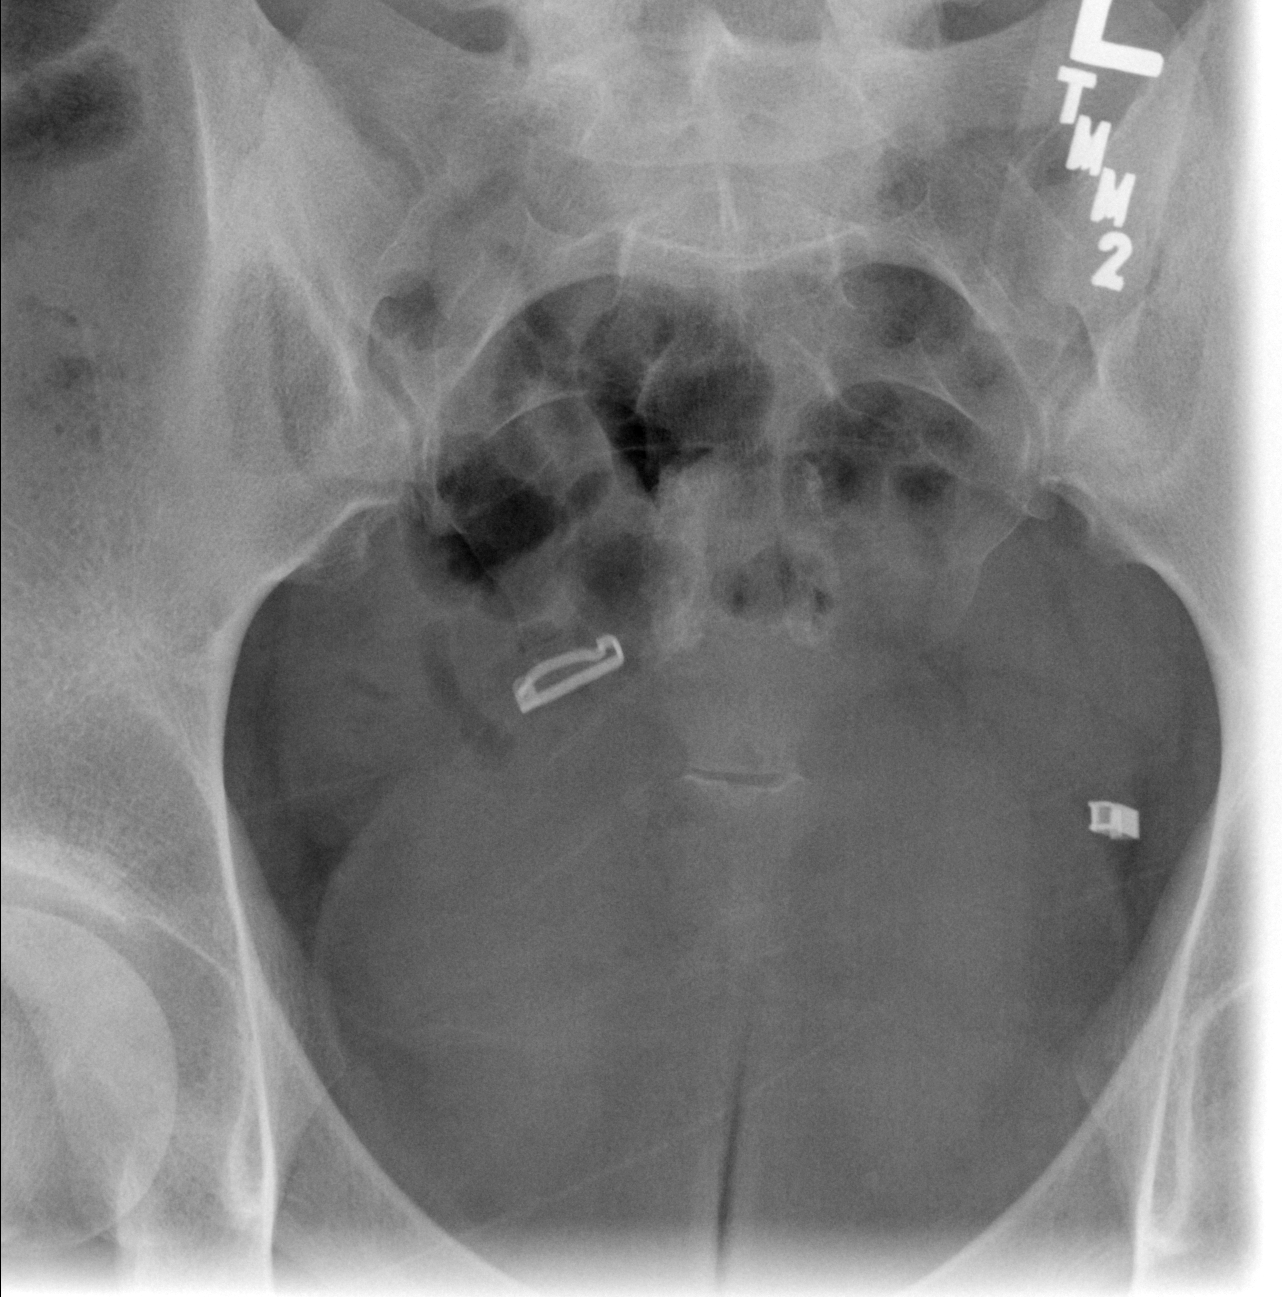

[t coccyx lat]
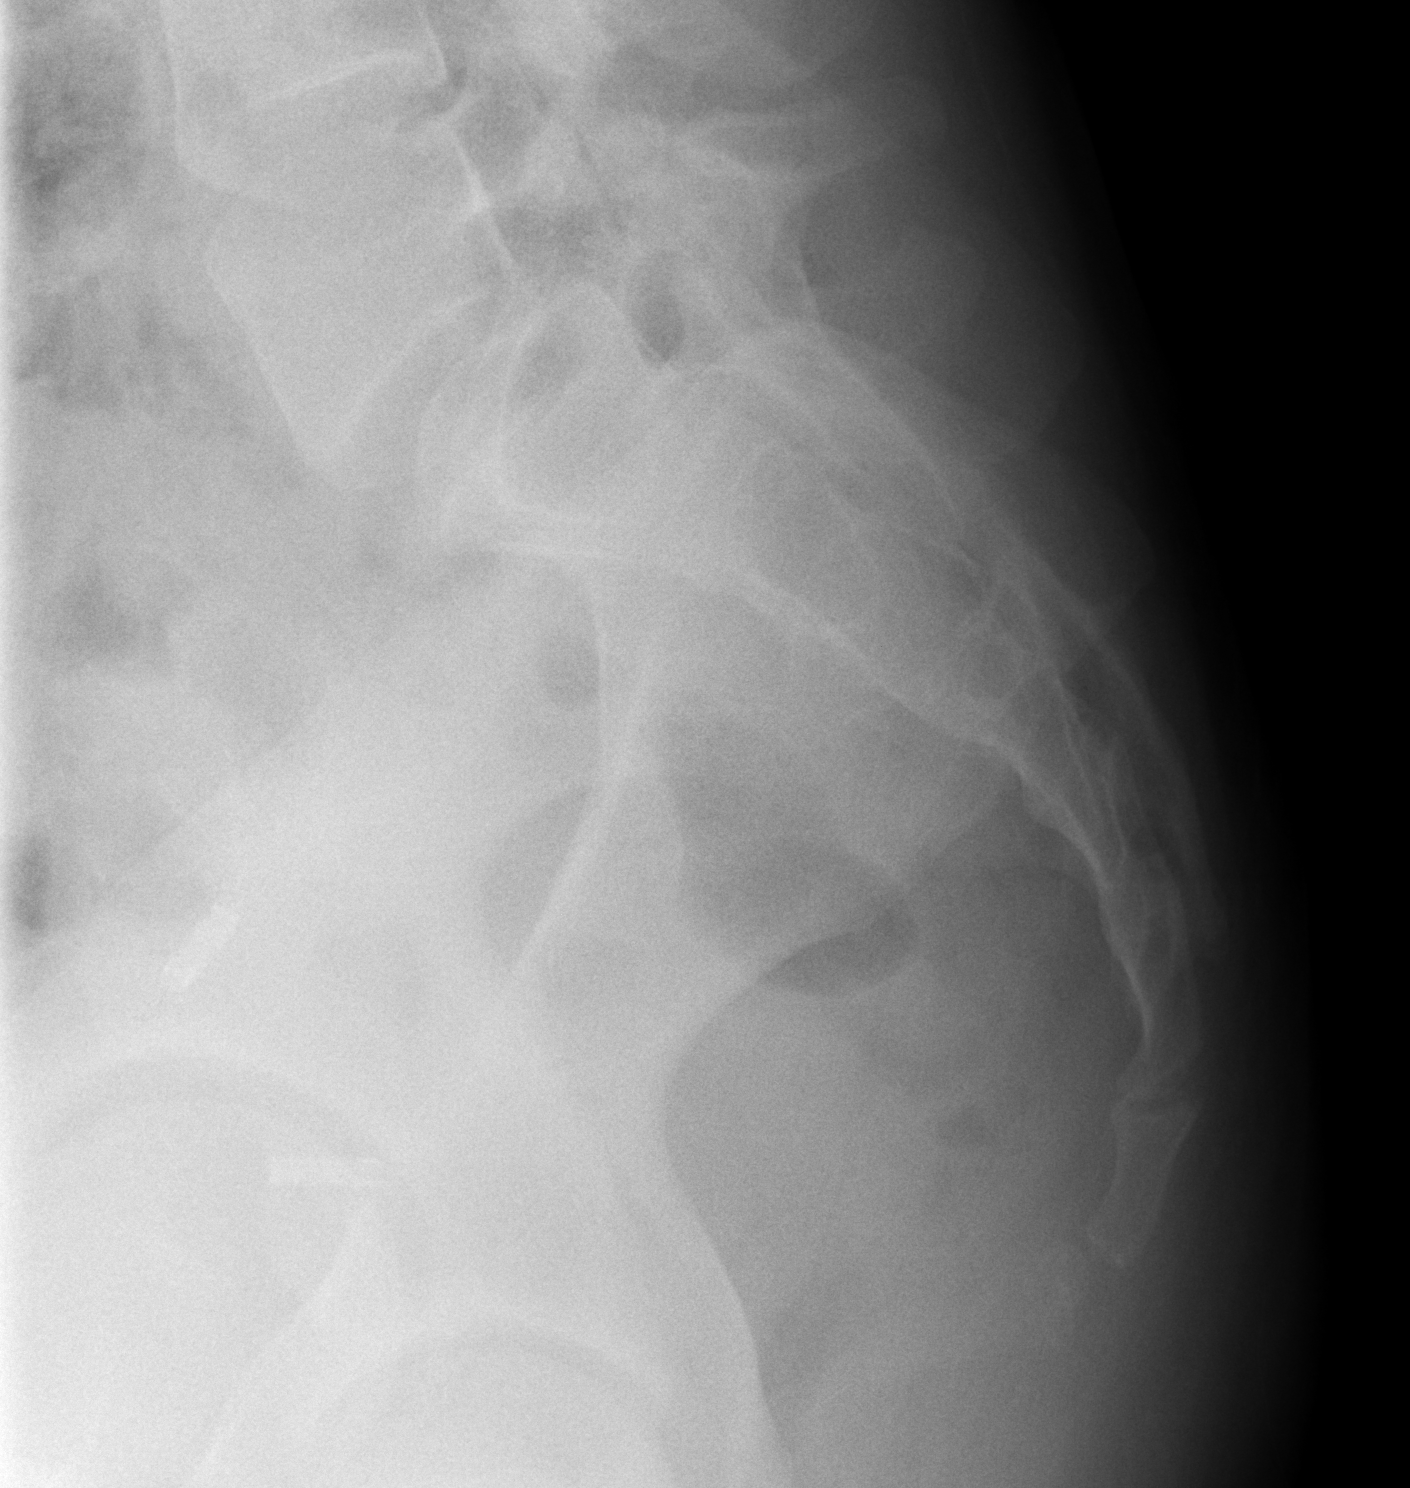

[3 of 3 positions shown; findings below may reference images not displayed]

FINDINGS: No fracture or dislocation of the sacrum or coccyx is
seen.  SI joints appear intact.

Bilateral pars interarticularis defects are seen at the level of
L5.  There is grade 1 severity spondylolisthesis of the body of L5
anteriorly on the body of S1.
IMPRESSION: Bilateral pars interarticularis defects are seen at the level of
L5.  There is grade 1 severity spondylolisthesis of the body of L5
anteriorly on the body of S1.  No abnormality of sacrum or coccyx
is seen.

## 2011-02-14 IMAGING — CR DG ABDOMEN 2V
2 series · 2 of 2 positions shown · non-contrast
Comparison: None

CLINICAL DATA: Right-sided abdominal and pelvic pain.  Vomiting.

ABDOMEN - 2 VIEW

[view not recorded (1 of 2)]
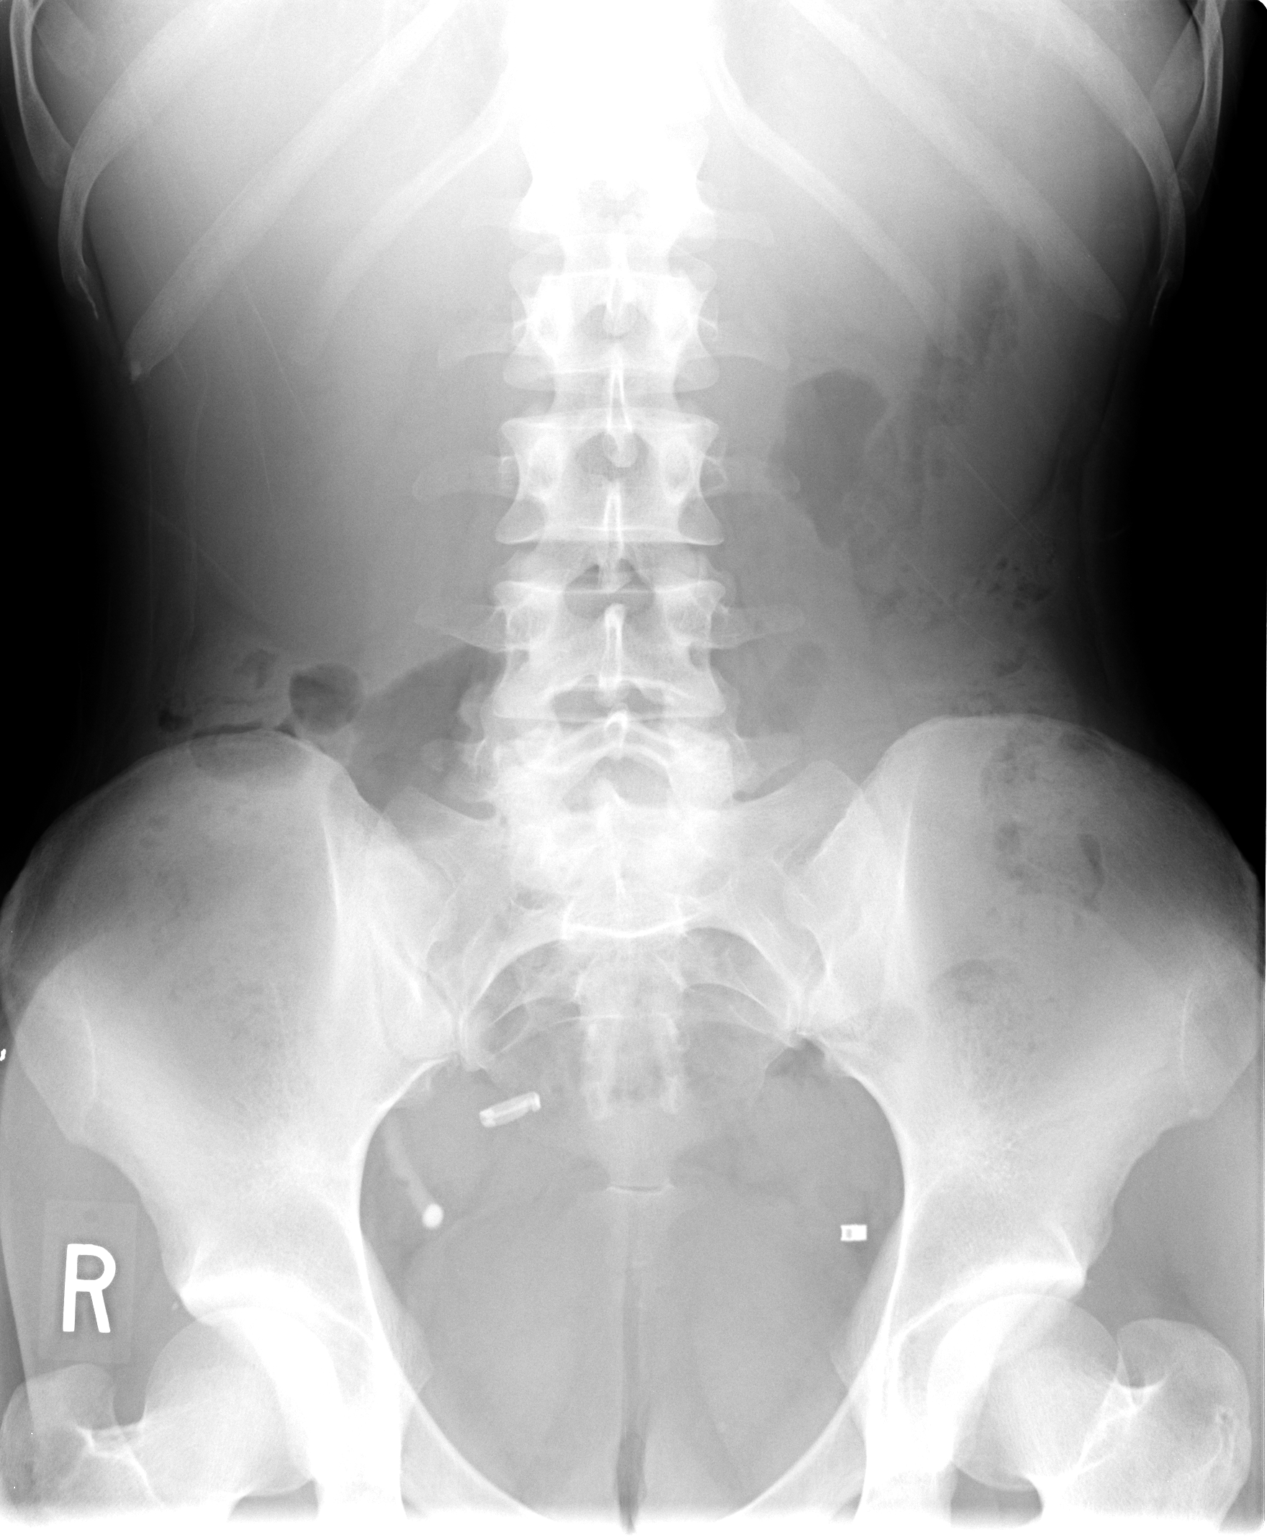

[view not recorded (2 of 2)]
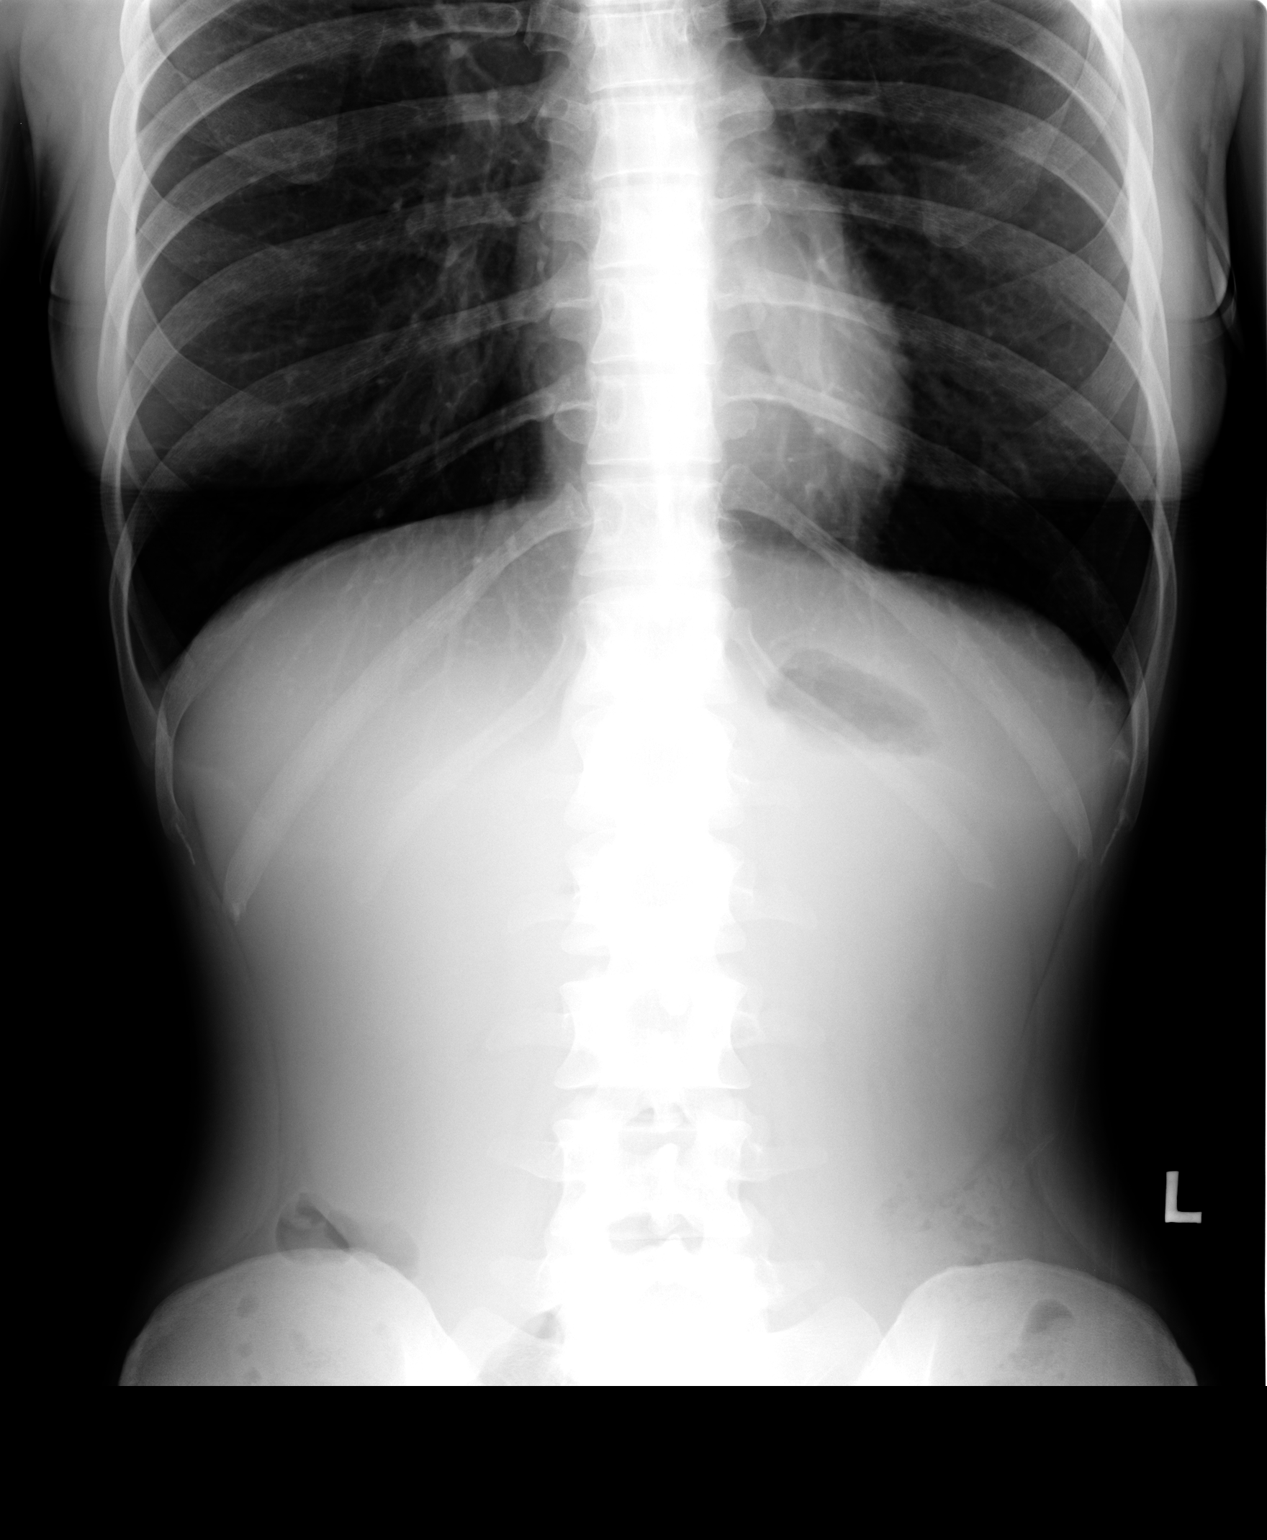

[2 of 2 positions shown; findings below may reference images not displayed]

FINDINGS: The bowel gas pattern is normal.  There is no evidence of
air-fluid levels.  No free air identified.  Small amount of
residual contrast is noted the appendix from recent CT.  Clips are
seen from prior bilateral tubal ligation.
IMPRESSION: Normal bowel gas pattern.  No acute findings.

## 2011-04-06 LAB — CBC
MCHC: 34.2
Platelets: 254
RBC: 4.71
RDW: 12.4

## 2011-04-06 LAB — WET PREP, GENITAL

## 2011-04-06 LAB — COMPREHENSIVE METABOLIC PANEL
ALT: 18
AST: 42 — ABNORMAL HIGH
Albumin: 4.3
CO2: 21
Calcium: 9.7
GFR calc Af Amer: >60
Sodium: 138
Total Protein: 6.7

## 2011-04-06 LAB — DIFFERENTIAL
Eosinophils Absolute: 0
Eosinophils Relative: 0
Lymphs Abs: 2
Monocytes Absolute: 0.8
Monocytes Relative: 5

## 2011-04-06 LAB — URINALYSIS, ROUTINE W REFLEX MICROSCOPIC
Bilirubin Urine: NEGATIVE
Hgb urine dipstick: NEGATIVE
Specific Gravity, Urine: 1.02
pH: 8

## 2011-04-06 LAB — GC/CHLAMYDIA PROBE AMP, GENITAL
Chlamydia, DNA Probe: NEGATIVE
GC Probe Amp, Genital: NEGATIVE

## 2011-04-14 LAB — POCT URINALYSIS DIP (DEVICE)
Ketones, ur: NEGATIVE
Protein, ur: NEGATIVE
Specific Gravity, Urine: 1.01
pH: 6

## 2014-01-20 ENCOUNTER — Inpatient Hospital Stay (HOSPITAL_COMMUNITY)
Admission: AD | Admit: 2014-01-20 | Discharge: 2014-01-21 | Disposition: A | Discharge status: Home / Self Care | Payer: Medicaid Other | Source: Ambulatory Visit | Attending: Obstetrics and Gynecology | Admitting: Obstetrics and Gynecology

## 2014-01-20 ENCOUNTER — Encounter (HOSPITAL_COMMUNITY): Payer: Self-pay

## 2014-01-20 ENCOUNTER — Inpatient Hospital Stay (HOSPITAL_COMMUNITY): Payer: Medicaid Other

## 2014-01-20 DIAGNOSIS — Z87891 Personal history of nicotine dependence: Secondary | ICD-10-CM | POA: Diagnosis not present

## 2014-01-20 DIAGNOSIS — Z765 Malingerer [conscious simulation]: Secondary | ICD-10-CM

## 2014-01-20 DIAGNOSIS — F1110 Opioid abuse, uncomplicated: Secondary | ICD-10-CM | POA: Insufficient documentation

## 2014-01-20 DIAGNOSIS — G8929 Other chronic pain: Secondary | ICD-10-CM

## 2014-01-20 DIAGNOSIS — R112 Nausea with vomiting, unspecified: Secondary | ICD-10-CM

## 2014-01-20 DIAGNOSIS — R109 Unspecified abdominal pain: Secondary | ICD-10-CM | POA: Insufficient documentation

## 2014-01-20 DIAGNOSIS — Z8742 Personal history of other diseases of the female genital tract: Secondary | ICD-10-CM | POA: Diagnosis not present

## 2014-01-20 DIAGNOSIS — R102 Pelvic and perineal pain: Secondary | ICD-10-CM

## 2014-01-20 HISTORY — DX: Endometriosis, unspecified: N80.9

## 2014-01-20 MED ORDER — HYDROMORPHONE HCL PF 1 MG/ML IJ SOLN
1.0000 mg | Freq: Once | INTRAMUSCULAR | Status: AC
  Administered 2014-01-20: 1 mg via INTRAVENOUS
  Filled 2014-01-20: qty 1

## 2014-01-20 MED ORDER — ONDANSETRON HCL 4 MG/2ML IJ SOLN
4.0000 mg | Freq: Once | INTRAMUSCULAR | Status: AC
  Administered 2014-01-20: 4 mg via INTRAVENOUS
  Filled 2014-01-20: qty 2

## 2014-01-20 MED ORDER — HYDROMORPHONE HCL PF 1 MG/ML IJ SOLN
1.0000 mg | Freq: Once | INTRAMUSCULAR | Status: AC
  Administered 2014-01-20: 1 mg via INTRAVENOUS

## 2014-01-20 MED ORDER — HYDROMORPHONE HCL PF 1 MG/ML IJ SOLN
INTRAMUSCULAR | Status: AC
  Administered 2014-01-20: 1 mg via INTRAVENOUS
  Filled 2014-01-20: qty 1

## 2014-01-20 MED ORDER — HYDROMORPHONE HCL PF 1 MG/ML IJ SOLN: 1.0000 mg | Freq: Once | INTRAMUSCULAR | Status: DC

## 2014-01-20 MED ORDER — LACTATED RINGERS IV BOLUS (SEPSIS)
1000.0000 mL | Freq: Once | INTRAVENOUS | Status: AC
  Administered 2014-01-20: 1000 mL via INTRAVENOUS

## 2014-01-20 NOTE — MAU Note (Signed)
Pt reports that she wants her ovaries out and wants hormones. That she cannot take this pain anymore.

## 2014-01-20 NOTE — MAU Note (Signed)
Lower abdominal pain & vomiting since 2pm today. Denies vaginal bleeding/vaginal discharge/diarrhea/fever/constipation/urinary complaints. Has partial hysterectomy in 2009 for endometriosis. States this pain & vomiting is due to her endometriosis, last episode occurred last month.

## 2014-01-20 NOTE — MAU Provider Note (Signed)
History     CSN: [id number]  Arrival date and time: 01/20/14 2156   First Provider Initiated Contact with Patient 01/20/14 2209      Chief Complaint  Patient presents with  . Abdominal Pain  . Emesis   HPI [name] is a 28 y.o. G2P2002 who presents to MAU today with complaint of severe lower abdominal pain since 1400 today. She states a history of endometriosis and has had a hysterectomy for this. She states that she has acute onset severe pain irregularly that she feels is because of endometriosis. She rates her pain at 7/10 now. She states a small amount of white, thin discharge. She denies vaginal bleeding. She has had associated N/V. She denies fever. She has taken Phenergan, Zofran and Tylenol without relief today.   OB History   Grav Para Term Preterm Abortions TAB SAB Ect Mult Living   2 2 2  0 0 0 0 0 0 2      Past Medical History  Diagnosis Date  . Endometriosis     Past Surgical History  Procedure Laterality Date  . Partial hysterectomy  2009    left her ovaries    No family history on file.  History  Substance Use Topics  . Smoking status: Not on file  . Smokeless tobacco: Not on file  . Alcohol Use: Not on file    Allergies:  Allergies  Allergen Reactions  . Contrast Media [Iodinated Diagnostic Agents] Anaphylaxis  . Reglan [Metoclopramide] Nausea And Vomiting  . Latex Rash    Prescriptions prior to admission  Medication Sig Dispense Refill  . GABAPENTIN PO Take 1 capsule by mouth 3 (three) times daily.      . Ondansetron HCl (ZOFRAN PO) Take 1 tablet by mouth 3 (three) times daily as needed (For nausea and vomitting.).      Marland Kitchen PROMETHAZINE HCL PO Take 1 tablet by mouth every 6 (six) hours as needed (For nausea.).        Review of Systems  Constitutional: Negative for fever and malaise/fatigue.  Gastrointestinal: Positive for nausea, vomiting and abdominal pain. Negative for diarrhea and constipation.  Genitourinary: Negative for  dysuria, urgency and frequency.       Neg - vaginal bleeding, abnormal discharge   Physical Exam   Blood pressure 132/81, pulse 97, temperature 97.7 F (36.5 C), temperature source Oral, resp. rate 18, SpO2 100.00%.  Physical Exam  Constitutional: She is oriented to person, place, and time. She appears well-developed and well-nourished.  Patient presents in severe pain and is curled up on the bed  HENT:  Head: Normocephalic and atraumatic.  Cardiovascular: Normal rate.   Respiratory: Effort normal.  GI: Soft. She exhibits no distension and no mass. There is tenderness (moderate diffuse tenderness to palpation of the abdomen). There is no rebound and no guarding.  Neurological: She is alert and oriented to person, place, and time.  Skin: Skin is warm and dry. No erythema.  Psychiatric: She has a normal mood and affect.   Results for orders placed during the hospital encounter of 01/20/14 (from the past 24 hour(s))  URINALYSIS, ROUTINE W REFLEX MICROSCOPIC     Status: Abnormal   Collection Time    01/21/14  1:00 AM      Result Value Ref Range   Color, Urine YELLOW  YELLOW   APPearance CLEAR  CLEAR   Specific Gravity, Urine 1.015  1.005 - 1.030   pH 8.5 (*) 5.0 - 8.0   Glucose,  UA NEGATIVE  NEGATIVE mg/dL   Hgb urine dipstick NEGATIVE  NEGATIVE   Bilirubin Urine NEGATIVE  NEGATIVE   Ketones, ur NEGATIVE  NEGATIVE mg/dL   Protein, ur 30 (*) NEGATIVE mg/dL   Urobilinogen, UA 1.0  0.0 - 1.0 mg/dL   Nitrite NEGATIVE  NEGATIVE   Leukocytes, UA NEGATIVE  NEGATIVE  URINE MICROSCOPIC-ADD ON     Status: Abnormal   Collection Time    01/21/14  1:00 AM      Result Value Ref Range   Squamous Epithelial / LPF RARE  RARE   WBC, UA 0-2  <3 WBC/hpf   RBC / HPF 0-2  <3 RBC/hpf   Bacteria, UA FEW (*) RARE   Urine-Other AMORPHOUS URATES/PHOSPHATES    CBC WITH DIFFERENTIAL     Status: Abnormal   Collection Time    01/21/14  1:13 AM      Result Value Ref Range   WBC 12.6 (*) 4.0 - 10.5 K/uL    RBC 4.05  3.87 - 5.11 MIL/uL   Hemoglobin 12.2  12.0 - 15.0 g/dL   HCT 16.1 (*) 09.6 - 04.5 %   MCV 87.9  78.0 - 100.0 fL   MCH 30.1  26.0 - 34.0 pg   MCHC 34.3  30.0 - 36.0 g/dL   RDW 40.9  81.1 - 91.4 %   Platelets 239  150 - 400 K/uL   Neutrophils Relative % 86 (*) 43 - 77 %   Neutro Abs 10.9 (*) 1.7 - 7.7 K/uL   Lymphocytes Relative 11 (*) 12 - 46 %   Lymphs Abs 1.4  0.7 - 4.0 K/uL   Monocytes Relative 3  3 - 12 %   Monocytes Absolute 0.3  0.1 - 1.0 K/uL   Eosinophils Relative 0  0 - 5 %   Eosinophils Absolute 0.0  0.0 - 0.7 K/uL   Basophils Relative 0  0 - 1 %   Basophils Absolute 0.0  0.0 - 0.1 K/uL  COMPREHENSIVE METABOLIC PANEL     Status: Abnormal   Collection Time    01/21/14  3:08 AM      Result Value Ref Range   Sodium 144  137 - 147 mEq/L   Potassium 3.5 (*) 3.7 - 5.3 mEq/L   Chloride 102  96 - 112 mEq/L   CO2 28  19 - 32 mEq/L   Glucose, Bld 118 (*) 70 - 99 mg/dL   BUN 10  6 - 23 mg/dL   Creatinine, Ser 7.82 (*) 0.50 - 1.10 mg/dL   Calcium 9.4  8.4 - 95.6 mg/dL   Total Protein 6.6  6.0 - 8.3 g/dL   Albumin 3.9  3.5 - 5.2 g/dL   AST 9  0 - 37 U/L   ALT 8  0 - 35 U/L   Alkaline Phosphatase 73  39 - 117 U/L   Total Bilirubin 0.5  0.3 - 1.2 mg/dL   GFR calc non Af Amer >90  >90 mL/min   GFR calc Af Amer >90  >90 mL/min   Anion gap 14  5 - 15   US Transvaginal Non-ob  01/21/2014   CLINICAL DATA:  Pelvic pain  EXAM: TRANSABDOMINAL AND TRANSVAGINAL ULTRASOUND OF PELVIS  TECHNIQUE: Study was performed transabdominally to optimize pelvic field of view evaluation and transvaginally to optimize internal visceral architecture evaluation.  COMPARISON:  Nov 13, 2008  FINDINGS: Uterus  Surgically absent.  Right ovary  Unable to visualize by transabdominal or transvaginal technique;  question absence.  Left ovary  Measurements: 4.7 x 2.8 x 1.9 cm. Normal appearance/no adnexal mass.  Other findings  Trace free fluid. There is debris in the urinary bladder. There is some  thickening of the urinary bladder wall.  IMPRESSION: Uterus absent. Left ovary appears normal. Right ovary could not be visualized. Question absence. No pelvic mass. Minimal free pelvic fluid may be physiologic.  Evidence of cystitis with clearing debris and wall thickening in the urinary bladder.   Electronically Signed   By: Bretta BangWilliam  Woodruff M.D.   On: 01/21/2014 00:46   [place] Pelvis Complete  01/21/2014   CLINICAL DATA:  Pelvic pain  EXAM: TRANSABDOMINAL AND TRANSVAGINAL ULTRASOUND OF PELVIS  TECHNIQUE: Study was performed transabdominally to optimize pelvic field of view evaluation and transvaginally to optimize internal visceral architecture evaluation.  COMPARISON:  Nov 13, 2008  FINDINGS: Uterus  Surgically absent.  Right ovary  Unable to visualize by transabdominal or transvaginal technique; question absence.  Left ovary  Measurements: 4.7 x 2.8 x 1.9 cm. Normal appearance/no adnexal mass.  Other findings  Trace free fluid. There is debris in the urinary bladder. There is some thickening of the urinary bladder wall.  IMPRESSION: Uterus absent. Left ovary appears normal. Right ovary could not be visualized. Question absence. No pelvic mass. Minimal free pelvic fluid may be physiologic.  Evidence of cystitis with clearing debris and wall thickening in the urinary bladder.   Electronically Signed   By: Bretta BangWilliam  Woodruff M.D.   On: 01/21/2014 00:46    MAU Course  Procedures None  MDM 2 mg Dilaudid given in MAU upon arrival. Patient is writhing in bed and unable to talk to me or be still for an exam.  4 mg Zofran given as patient is actively vomiting upon admission 1 liter IV LR ordered Patient continues to complain of severe pain 30 mg IV toradol given Patient started vomiting again - 12.5 IV phenergan given US results are mostly normal CT scan ordered. Patient states allergy to IV contrast dye. Discussed with radiology. Study would be better with some contrast. Will attempt PO contrast.  4  zofran IV given prior to attempting PO contrast.  CT scan negative for acute abnormalities Patient is asleep upon entering the room on numerous occasions and when aroused to discuss treatment patient begins gagging, but I have not seen much true emesis while in MAU today  Notes from Care Everywhere:  [facility] shows that patient did not have endometriosis noted at the time of her hysterectomy. It also indicates a history of narcotic abuse and drug use.  [place] narcotic database shows that patient has received 7 Rx for Fentanyl patches #5 and 9 Rx for Vicodin #42 from the same MD in [place], [place] since March 2015 Numerous attempts were made to contact PCP that was providing all of the Rx listed above without success. Unable to talk to anyone in the office, continued to get recording and extended hold.   Discussed patient with Dr. Jolayne Pantheronstant. Ok for discharge. No clinical indication for additional treatment or testing. All testing is negative today.  Assessment and Plan  A: Chronic abdominal pain Narcotic abuse  P: Discharge home Rx for Phenergan, Zofran and Percocet given to patient Patient scheduled for follow-up with [facility] on 01/29/14 Patient advised that she will need to keep clinic follow-up and will not be given additional narcotic Rx through MAU Patient may return to MAU as needed or if her condition  were to change or worsen   Freddi Starr, PA-C  01/21/2014, 4:58 AM

## 2014-01-21 ENCOUNTER — Inpatient Hospital Stay (HOSPITAL_COMMUNITY): Payer: Medicaid Other

## 2014-01-21 ENCOUNTER — Encounter (HOSPITAL_COMMUNITY): Payer: Self-pay | Admitting: *Deleted

## 2014-01-21 DIAGNOSIS — Z765 Malingerer [conscious simulation]: Secondary | ICD-10-CM

## 2014-01-21 DIAGNOSIS — N949 Unspecified condition associated with female genital organs and menstrual cycle: Secondary | ICD-10-CM

## 2014-01-21 DIAGNOSIS — G8929 Other chronic pain: Secondary | ICD-10-CM

## 2014-01-21 LAB — COMPREHENSIVE METABOLIC PANEL
ALBUMIN: 3.9 g/dL (ref 3.5–5.2)
ALK PHOS: 73 U/L (ref 39–117)
ALT: 8 U/L (ref 0–35)
ANION GAP: 14 (ref 5–15)
AST: 9 U/L (ref 0–37)
BUN: 10 mg/dL (ref 6–23)
CHLORIDE: 102 meq/L (ref 96–112)
CO2: 28 mEq/L (ref 19–32)
Calcium: 9.4 mg/dL (ref 8.4–10.5)
Creatinine, Ser: 0.46 mg/dL — ABNORMAL LOW (ref 0.50–1.10)
GFR calc Af Amer: >90 mL/min (ref >90)
GFR calc non Af Amer: >90 mL/min (ref >90)
Glucose, Bld: 118 mg/dL — ABNORMAL HIGH (ref 70–99)
POTASSIUM: 3.5 meq/L — AB (ref 3.7–5.3)
Sodium: 144 mEq/L (ref 137–147)
TOTAL PROTEIN: 6.6 g/dL (ref 6.0–8.3)
Total Bilirubin: 0.5 mg/dL (ref 0.3–1.2)

## 2014-01-21 LAB — CBC WITH DIFFERENTIAL/PLATELET
BASOS ABS: 0 10*3/uL (ref 0.0–0.1)
BASOS PCT: 0 % (ref 0–1)
EOS ABS: 0 10*3/uL (ref 0.0–0.7)
Eosinophils Relative: 0 % (ref 0–5)
HCT: 35.6 % — ABNORMAL LOW (ref 36.0–46.0)
Hemoglobin: 12.2 g/dL (ref 12.0–15.0)
Lymphocytes Relative: 11 % — ABNORMAL LOW (ref 12–46)
Lymphs Abs: 1.4 10*3/uL (ref 0.7–4.0)
MCH: 30.1 pg (ref 26.0–34.0)
MCHC: 34.3 g/dL (ref 30.0–36.0)
MCV: 87.9 fL (ref 78.0–100.0)
MONOS PCT: 3 % (ref 3–12)
Monocytes Absolute: 0.3 10*3/uL (ref 0.1–1.0)
NEUTROS ABS: 10.9 10*3/uL — AB (ref 1.7–7.7)
NEUTROS PCT: 86 % — AB (ref 43–77)
PLATELETS: 239 10*3/uL (ref 150–400)
RBC: 4.05 MIL/uL (ref 3.87–5.11)
RDW: 13.2 % (ref 11.5–15.5)
WBC: 12.6 10*3/uL — ABNORMAL HIGH (ref 4.0–10.5)

## 2014-01-21 LAB — URINE MICROSCOPIC-ADD ON

## 2014-01-21 LAB — URINALYSIS, ROUTINE W REFLEX MICROSCOPIC
BILIRUBIN URINE: NEGATIVE
Glucose, UA: NEGATIVE mg/dL
Hgb urine dipstick: NEGATIVE
KETONES UR: NEGATIVE mg/dL
LEUKOCYTES UA: NEGATIVE
NITRITE: NEGATIVE
PH: 8.5 — AB (ref 5.0–8.0)
PROTEIN: 30 mg/dL — AB
Specific Gravity, Urine: 1.015 (ref 1.005–1.030)
UROBILINOGEN UA: 1 mg/dL (ref 0.0–1.0)

## 2014-01-21 LAB — RAPID URINE DRUG SCREEN, HOSP PERFORMED
Amphetamines: NOT DETECTED
BARBITURATES: NOT DETECTED
Benzodiazepines: POSITIVE — AB
Cocaine: NOT DETECTED
OPIATES: NOT DETECTED
TETRAHYDROCANNABINOL: POSITIVE — AB

## 2014-01-21 LAB — AMYLASE: Amylase: 38 U/L (ref 0–105)

## 2014-01-21 MED ORDER — OXYCODONE-ACETAMINOPHEN 5-325 MG PO TABS: 1.0000 | ORAL_TABLET | Freq: Four times a day (QID) | ORAL | Status: DC | PRN

## 2014-01-21 MED ORDER — KETOROLAC TROMETHAMINE 30 MG/ML IJ SOLN
30.0000 mg | Freq: Once | INTRAMUSCULAR | Status: DC
  Filled 2014-01-21: qty 1

## 2014-01-21 MED ORDER — PROMETHAZINE HCL 12.5 MG PO TABS: 12.5000 mg | ORAL_TABLET | ORAL | Status: DC | PRN

## 2014-01-21 MED ORDER — PROMETHAZINE HCL 25 MG/ML IJ SOLN
12.5000 mg | Freq: Once | INTRAMUSCULAR | Status: AC
  Administered 2014-01-21: 12.5 mg via INTRAVENOUS
  Filled 2014-01-21: qty 1

## 2014-01-21 MED ORDER — LACTATED RINGERS IV SOLN
Freq: Once | INTRAVENOUS | Status: AC
  Administered 2014-01-21: 04:00:00 via INTRAVENOUS

## 2014-01-21 MED ORDER — ONDANSETRON HCL 4 MG/2ML IJ SOLN
4.0000 mg | Freq: Once | INTRAMUSCULAR | Status: AC
  Administered 2014-01-21: 4 mg via INTRAVENOUS
  Filled 2014-01-21: qty 2

## 2014-01-21 MED ORDER — ONDANSETRON HCL 8 MG PO TABS: 8.0000 mg | ORAL_TABLET | Freq: Three times a day (TID) | ORAL | Status: DC | PRN

## 2014-01-21 MED ORDER — KETOROLAC TROMETHAMINE 30 MG/ML IJ SOLN
30.0000 mg | Freq: Once | INTRAMUSCULAR | Status: AC
  Administered 2014-01-21: 30 mg via INTRAVENOUS

## 2014-01-21 NOTE — Progress Notes (Signed)
Patient now states that she has a place to go, but needs transportation. States she cannot call the people because their phone has been disconnected. States she will just have to go there. States she knows they will let her stay there or will help her find another place.

## 2014-01-21 NOTE — Discharge Instructions (Signed)
Endometriosis  Endometriosis is a condition in which the tissue that lines the uterus (endometrium) grows outside of its normal location. The tissue may grow in many locations close to the uterus, but it commonly grows on the ovaries, fallopian tubes, vagina, or bowel. Because the uterus expels, or sheds, its lining every menstrual cycle, there is bleeding wherever the endometrial tissue is located. This can cause pain because blood is irritating to tissues not normally exposed to it.   CAUSES   The cause of endometriosis is not known.   SIGNS AND SYMPTOMS   Often, there are no symptoms. When symptoms are present, they can vary with the location of the displaced tissue. Various symptoms can occur at different times. Although symptoms occur mainly during a woman's menstrual period, they can also occur midcycle and usually stop with menopause. Some people may go months with no symptoms at all. Symptoms may include:   · Back or abdominal pain.    · Heavier bleeding during periods.    · Pain during intercourse.    · Painful bowel movements.    · Infertility.  DIAGNOSIS   Your health care provider will do a physical exam and ask about your symptoms. Various tests may be done, such as:   · Blood tests and urine tests. These are done to help rule out other problems.    · Ultrasound. This test is done to look for abnormal tissue.    · An X-ray of the lower bowel (barium enema).   · Laparoscopy. In this procedure, a thin, lighted tube with a tiny camera on the end (laparoscope) is inserted into your abdomen. This helps your health care provider look for abnormal tissue to confirm the diagnosis. The health care provider may also remove a small piece of tissue (biopsy) from any abnormal tissue found. This tissue sample can then be sent to a lab so it can be looked at under a microscope.  TREATMENT   Treatment will vary and may include:   · Medicines to relieve pain.  Nonsteroidal anti-inflammatory drugs (NSAIDs) are a type of  pain medicine that can help to relieve the pain caused by endometriosis.  · Hormonal therapy. When using hormonal therapy, periods are eliminated. This eliminates the monthly exposure to blood by the displaced endometrial tissue.    · Surgery. Surgery may sometimes be done to remove the abnormal endometrial tissue. In severe cases, surgery may be done to remove the fallopian tubes, uterus, and ovaries (hysterectomy).  HOME CARE INSTRUCTIONS   · Only take over-the-counter or prescription medicines for pain, discomfort, or fever as directed by your health care provider. Do not take aspirin because it may increase bleeding when you are not on hormonal therapy.    · Avoid activities that produce pain, including sexual activity.  SEEK MEDICAL CARE IF:  · You have pelvic pain before, after, or during your periods.  · You have pelvic pain between periods that gets worse during your period.  · You have pelvic pain during or after sex.  · You have pelvic pain with bowel movements or urination, especially during your period.  · You have problems getting pregnant.  SEEK IMMEDIATE MEDICAL CARE IF:   · Your pain is severe and is not responding to pain medicine.    · You have severe nausea and vomiting, or you cannot keep foods down.    · You have pain that is limited to the right lower part of your abdomen.    · You have swelling or increasing pain in your abdomen.    ·   You see blood in your stool.    · You have a fever or persistent symptoms for more than 2-3 days.    · You have a fever and your symptoms suddenly get worse.  MAKE SURE YOU:   · Understand these instructions.  · Will watch your condition.  · Will get help right away if you are not doing well or get worse.  Document Released: 06/23/2000 Document Revised: 04/16/2013 Document Reviewed: 02/21/2013  [employer]® Patient Information ©2015 [employer]. This information is not intended to replace advice given to you by your health care provider. Make sure you discuss any  questions you have with your health care provider.

## 2014-01-21 NOTE — Progress Notes (Signed)
Christina RidingColleen Wright, CSW was in to speak with patient. Patient then said that she had transportation and has called them to come and get her. Patient went out to lobby to wait for her ride. Patient was not drowsy, was not vomiting. Walked out without hesitation or limitations.
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
## 2014-01-21 NOTE — Progress Notes (Signed)
Report given to C. Clelia CroftShaw, CSW. She will see patient in MAU.

## 2014-01-21 NOTE — Progress Notes (Signed)
With permission from patient and while in the room with patient, RN spoke with patient's mother. Mother wants patient to go to a shelter. States she cannot take patient home with her. States there is "too much baggage." States patient has been in "rehab" before and "now this." Wants social work called. Will report to MAU provider.

## 2014-01-22 ENCOUNTER — Telehealth: Payer: Self-pay | Admitting: General Practice

## 2014-01-22 NOTE — Telephone Encounter (Signed)
Patient called and left message stating she left the hospital yesterday and the soonest appt she got was for the 23rd but she will run out of her medication before she can come in to be seen and really needs this medicine refilled otherwise she will end back up in hospital. Called patient back and patient gave a lengthy description of her past medical history stating she's had problems ever since she delivered her son 7 years ago and that she was told she probably had endometriosis which was discovered when they did a partial hysterectomy and removed her cervix and uterus but they left her ovaries. Patient states things have not improved even since surgery and she is still have problems and pain and thinks she still has a problem with endometriosis which she was told by the doctor in the MAU that that was probably the case. Patient states she would also like to be done with everything and just have her ovaries removed. Patient states she is calling because she knows the medication prescribed will not last until her appt with us on the 23rd. Told patient that if her uterus has been removed then she can no longer have endometriosis. Patient verbalized understanding and states I don't know why someone told me that then, that's weird. Asked patient what medications is she referring to. Patient states zofran, phenergan, percocet, and gabapentin. Told patient that we did not prescribe her gabapentin. Patient states I know my doctor in [place] has been giving that to me for the past 7 years but states they won't now because [place] am [place] of the [place]. Told patient that information is incorrect as if she is an active patient there, they can refill that medication to a pharmacy in any city and recommended she call again. Told patient that we cannot refill those medications just because she thinks she is going to run out. Recommended to her that she take one pill at a time IF she needs it not just taking it "just in case she  needs it." Also discussed with patient that she needs to be positive about how she feels as well. She just received those prescriptions yesterday and if she expects to have pain and problems then she will. Patient verbalized understanding to all and had no other questions

## 2014-01-22 NOTE — MAU Provider Note (Signed)
Attestation of Attending Supervision of Advanced Practitioner (CNM/NP): Evaluation and management procedures were performed by the Advanced Practitioner under my supervision and collaboration.  I have reviewed the Advanced Practitioner's note and chart, and I agree with the management and plan.  Ryne Mctigue 01/22/2014 9:11 AM

## 2014-01-24 ENCOUNTER — Encounter (HOSPITAL_COMMUNITY): Payer: Self-pay | Admitting: *Deleted

## 2014-01-24 ENCOUNTER — Inpatient Hospital Stay (HOSPITAL_COMMUNITY)
Admission: AD | Admit: 2014-01-24 | Discharge: 2014-01-25 | Disposition: A | Discharge status: Home / Self Care | Payer: Medicaid Other | Source: Ambulatory Visit | Attending: Emergency Medicine | Admitting: Emergency Medicine

## 2014-01-24 DIAGNOSIS — R112 Nausea with vomiting, unspecified: Secondary | ICD-10-CM | POA: Insufficient documentation

## 2014-01-24 DIAGNOSIS — Z90710 Acquired absence of both cervix and uterus: Secondary | ICD-10-CM | POA: Insufficient documentation

## 2014-01-24 DIAGNOSIS — F1910 Other psychoactive substance abuse, uncomplicated: Secondary | ICD-10-CM | POA: Diagnosis not present

## 2014-01-24 DIAGNOSIS — R109 Unspecified abdominal pain: Secondary | ICD-10-CM | POA: Insufficient documentation

## 2014-01-24 DIAGNOSIS — Z8742 Personal history of other diseases of the female genital tract: Secondary | ICD-10-CM | POA: Insufficient documentation

## 2014-01-24 DIAGNOSIS — F17200 Nicotine dependence, unspecified, uncomplicated: Secondary | ICD-10-CM | POA: Diagnosis not present

## 2014-01-24 DIAGNOSIS — R1110 Vomiting, unspecified: Secondary | ICD-10-CM

## 2014-01-24 DIAGNOSIS — G8929 Other chronic pain: Secondary | ICD-10-CM

## 2014-01-24 DIAGNOSIS — Z765 Malingerer [conscious simulation]: Secondary | ICD-10-CM | POA: Diagnosis not present

## 2014-01-24 LAB — CBC WITH DIFFERENTIAL/PLATELET
Basophils Absolute: 0.1 10*3/uL (ref 0.0–0.1)
Basophils Relative: 1 % (ref 0–1)
EOS PCT: 1 % (ref 0–5)
Eosinophils Absolute: 0.1 10*3/uL (ref 0.0–0.7)
HEMATOCRIT: 40.3 % (ref 36.0–46.0)
HEMOGLOBIN: 14.2 g/dL (ref 12.0–15.0)
LYMPHS ABS: 2.9 10*3/uL (ref 0.7–4.0)
Lymphocytes Relative: 27 % (ref 12–46)
MCH: 30.8 pg (ref 26.0–34.0)
MCHC: 35.2 g/dL (ref 30.0–36.0)
MCV: 87.4 fL (ref 78.0–100.0)
MONOS PCT: 7 % (ref 3–12)
Monocytes Absolute: 0.8 10*3/uL (ref 0.1–1.0)
Neutro Abs: 7 10*3/uL (ref 1.7–7.7)
Neutrophils Relative %: 64 % (ref 43–77)
PLATELETS: 320 10*3/uL (ref 150–400)
RBC: 4.61 MIL/uL (ref 3.87–5.11)
RDW: 13 % (ref 11.5–15.5)
WBC: 10.8 10*3/uL — ABNORMAL HIGH (ref 4.0–10.5)

## 2014-01-24 LAB — COMPREHENSIVE METABOLIC PANEL
ALT: 7 U/L (ref 0–35)
AST: 15 U/L (ref 0–37)
Albumin: 4.3 g/dL (ref 3.5–5.2)
Alkaline Phosphatase: 74 U/L (ref 39–117)
Anion gap: 16 — ABNORMAL HIGH (ref 5–15)
BUN: 10 mg/dL (ref 6–23)
CALCIUM: 9.8 mg/dL (ref 8.4–10.5)
CO2: 26 mEq/L (ref 19–32)
Chloride: 97 mEq/L (ref 96–112)
Creatinine, Ser: 0.54 mg/dL (ref 0.50–1.10)
GLUCOSE: 116 mg/dL — AB (ref 70–99)
Potassium: 3.9 mEq/L (ref 3.7–5.3)
Sodium: 139 mEq/L (ref 137–147)
Total Bilirubin: 0.6 mg/dL (ref 0.3–1.2)
Total Protein: 7.1 g/dL (ref 6.0–8.3)

## 2014-01-24 LAB — URINALYSIS, ROUTINE W REFLEX MICROSCOPIC
BILIRUBIN URINE: NEGATIVE
Glucose, UA: NEGATIVE mg/dL
Hgb urine dipstick: NEGATIVE
KETONES UR: 15 mg/dL — AB
LEUKOCYTES UA: NEGATIVE
NITRITE: NEGATIVE
PROTEIN: NEGATIVE mg/dL
Specific Gravity, Urine: 1.01 (ref 1.005–1.030)
Urobilinogen, UA: 1 mg/dL (ref 0.0–1.0)
pH: 7.5 (ref 5.0–8.0)

## 2014-01-24 LAB — LIPASE, BLOOD: LIPASE: 23 U/L (ref 11–59)

## 2014-01-24 LAB — RAPID URINE DRUG SCREEN, HOSP PERFORMED
AMPHETAMINES: NOT DETECTED
BARBITURATES: NOT DETECTED
Benzodiazepines: NOT DETECTED
Cocaine: POSITIVE — AB
Opiates: NOT DETECTED
Tetrahydrocannabinol: POSITIVE — AB

## 2014-01-24 LAB — AMYLASE: Amylase: 42 U/L (ref 0–105)

## 2014-01-24 MED ORDER — PROMETHAZINE HCL 25 MG/ML IJ SOLN
12.5000 mg | Freq: Once | INTRAMUSCULAR | Status: AC
  Administered 2014-01-24: 12.5 mg via INTRAVENOUS
  Filled 2014-01-24: qty 1

## 2014-01-24 MED ORDER — LACTATED RINGERS IV SOLN
Freq: Once | INTRAVENOUS | Status: AC
  Administered 2014-01-24: via INTRAVENOUS

## 2014-01-24 MED ORDER — KETOROLAC TROMETHAMINE 30 MG/ML IJ SOLN
30.0000 mg | Freq: Once | INTRAMUSCULAR | Status: AC
  Administered 2014-01-24: 30 mg via INTRAVENOUS
  Filled 2014-01-24: qty 1

## 2014-01-24 MED ORDER — HYDROMORPHONE HCL PF 1 MG/ML IJ SOLN
1.0000 mg | Freq: Once | INTRAMUSCULAR | Status: AC
  Administered 2014-01-24: 1 mg via INTRAVENOUS
  Filled 2014-01-24: qty 1

## 2014-01-24 MED ORDER — LACTATED RINGERS IV SOLN
Freq: Once | INTRAVENOUS | Status: AC
  Administered 2014-01-24: 20:00:00 via INTRAVENOUS
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
## 2014-01-24 NOTE — MAU Note (Signed)
Patient presents stating that she was discharged Wednesday from MAU and the emesis/abdominal pain has not stopped.

## 2014-01-24 NOTE — MAU Provider Note (Signed)
Attestation of Attending Supervision of Advanced Practitioner (CNM/NP): Evaluation and management procedures were performed by the Advanced Practitioner under my supervision and collaboration.  I have reviewed the Advanced Practitioner's note and chart, and I agree with the management and plan.  HARRAWAY-SMITH, Earlin Sweeden 10:36 PM

## 2014-01-25 MED ORDER — PROMETHAZINE HCL 25 MG RE SUPP: 25.0000 mg | Freq: Four times a day (QID) | RECTAL | Status: DC | PRN

## 2014-01-25 MED ORDER — ONDANSETRON HCL 4 MG/2ML IJ SOLN
4.0000 mg | Freq: Once | INTRAMUSCULAR | Status: DC
  Filled 2014-01-25: qty 2

## 2014-01-25 MED ORDER — SODIUM CHLORIDE 0.9 % IV SOLN
Freq: Once | INTRAVENOUS | Status: AC
  Administered 2014-01-25: 1000 mL via INTRAVENOUS

## 2014-01-25 MED ORDER — ONDANSETRON HCL 4 MG/2ML IJ SOLN
4.0000 mg | Freq: Once | INTRAMUSCULAR | Status: AC
  Administered 2014-01-25: 4 mg via INTRAVENOUS

## 2014-01-25 MED ORDER — DICYCLOMINE HCL 20 MG PO TABS: 20.0000 mg | ORAL_TABLET | Freq: Four times a day (QID) | ORAL | Status: DC | PRN

## 2014-01-25 MED ORDER — HALOPERIDOL LACTATE 5 MG/ML IJ SOLN
2.5000 mg | Freq: Once | INTRAMUSCULAR | Status: AC
  Administered 2014-01-25: 2.5 mg via INTRAMUSCULAR
  Filled 2014-01-25: qty 1

## 2014-01-25 MED ORDER — DICYCLOMINE HCL 10 MG/ML IM SOLN
20.0000 mg | Freq: Once | INTRAMUSCULAR | Status: AC
  Administered 2014-01-25: 20 mg via INTRAMUSCULAR
  Filled 2014-01-25: qty 2

## 2014-01-25 NOTE — ED Notes (Signed)
Bed: [id number] Expected date:  Expected time:  Means of arrival:  Comments: [facility]

## 2014-01-25 NOTE — Discharge Instructions (Signed)
Abdominal Pain, Women °Abdominal (stomach, pelvic, or belly) pain can be caused by many things. It is important to tell your doctor: °· The location of the pain. °· Does it come and go or is it present all the time? °· Are there things that start the pain (eating certain foods, exercise)? °· Are there other symptoms associated with the pain (fever, nausea, vomiting, diarrhea)? °All of this is helpful to know when trying to find the cause of the pain. °CAUSES  °· Stomach: virus or bacteria infection, or ulcer. °· Intestine: appendicitis (inflamed appendix), regional ileitis (Crohn's disease), ulcerative colitis (inflamed colon), irritable bowel syndrome, diverticulitis (inflamed diverticulum of the colon), or cancer of the stomach or intestine. °· Gallbladder disease or stones in the gallbladder. °· Kidney disease, kidney stones, or infection. °· Pancreas infection or cancer. °· Fibromyalgia (pain disorder). °· Diseases of the female organs: °· Uterus: fibroid (non-cancerous) tumors or infection. °· Fallopian tubes: infection or tubal pregnancy. °· Ovary: cysts or tumors. °· Pelvic adhesions (scar tissue). °· Endometriosis (uterus lining tissue growing in the pelvis and on the pelvic organs). °· Pelvic congestion syndrome (female organs filling up with blood just before the menstrual period). °· Pain with the menstrual period. °· Pain with ovulation (producing an egg). °· Pain with an IUD (intrauterine device, birth control) in the uterus. °· Cancer of the female organs. °· Functional pain (pain not caused by a disease, may improve without treatment). °· Psychological pain. °· Depression. °DIAGNOSIS  °Your doctor will decide the seriousness of your pain by doing an examination. °· Blood tests. °· X-rays. °· Ultrasound. °· CT scan (computed tomography, special type of X-ray). °· MRI (magnetic resonance imaging). °· Cultures, for infection. °· Barium enema (dye inserted in the large intestine, to better view it with  X-rays). °· Colonoscopy (looking in intestine with a lighted tube). °· Laparoscopy (minor surgery, looking in abdomen with a lighted tube). °· Major abdominal exploratory surgery (looking in abdomen with a large incision). °TREATMENT  °The treatment will depend on the cause of the pain.  °· Many cases can be observed and treated at home. °· Over-the-counter medicines recommended by your caregiver. °· Prescription medicine. °· Antibiotics, for infection. °· Birth control pills, for painful periods or for ovulation pain. °· Hormone treatment, for endometriosis. °· Nerve blocking injections. °· Physical therapy. °· Antidepressants. °· Counseling with a psychologist or psychiatrist. °· Minor or major surgery. °HOME CARE INSTRUCTIONS  °· Do not take laxatives, unless directed by your caregiver. °· Take over-the-counter pain medicine only if ordered by your caregiver. Do not take aspirin because it can cause an upset stomach or bleeding. °· Try a clear liquid diet (broth or water) as ordered by your caregiver. Slowly move to a bland diet, as tolerated, if the pain is related to the stomach or intestine. °· Have a thermometer and take your temperature several times a day, and record it. °· Bed rest and sleep, if it helps the pain. °· Avoid sexual intercourse, if it causes pain. °· Avoid stressful situations. °· Keep your follow-up appointments and tests, as your caregiver orders. °· If the pain does not go away with medicine or surgery, you may try: °· Acupuncture. °· Relaxation exercises (yoga, meditation). °· Group therapy. °· Counseling. °SEEK MEDICAL CARE IF:  °· You notice certain foods cause stomach pain. °· Your home care treatment is not helping your pain. °· You need stronger pain medicine. °· You want your IUD removed. °· You feel faint or   lightheaded. °· You develop nausea and vomiting. °· You develop a rash. °· You are having side effects or an allergy to your medicine. °SEEK IMMEDIATE MEDICAL CARE IF:  °· Your  pain does not go away or gets worse. °· You have a fever. °· Your pain is felt only in portions of the abdomen. The right side could possibly be appendicitis. The left lower portion of the abdomen could be colitis or diverticulitis. °· You are passing blood in your stools (bright red or black tarry stools, with or without vomiting). °· You have blood in your urine. °· You develop chills, with or without a fever. °· You pass out. °MAKE SURE YOU:  °· Understand these instructions. °· Will watch your condition. °· Will get help right away if you are not doing well or get worse. °Document Released: 04/23/2007 Document Revised: 09/18/2011 Document Reviewed: 05/13/2009 °Exit[employer]® Patient Information ©2015 [employer]. This information is not intended to replace advice given to you by your health care provider. Make sure you discuss any questions you have with your health care provider. ° °Chronic Pain °Chronic pain can be defined as pain that is off and on and lasts for 3-6 months or longer. Many things cause chronic pain, which can make it difficult to make a diagnosis. There are many treatment options available for chronic pain. However, finding a treatment that works well for you may require trying various approaches until the right one is found. Many people benefit from a combination of two or more types of treatment to control their pain. °SYMPTOMS  °Chronic pain can occur anywhere in the body and can range from mild to very severe. Some types of chronic pain include: °· Headache. °· Low back pain. °· Cancer pain. °· Arthritis pain. °· Neurogenic pain. This is pain resulting from damage to nerves. ° People with chronic pain may also have other symptoms such as: °· Depression. °· Anger. °· Insomnia. °· Anxiety. °DIAGNOSIS  °Your health care provider will help diagnose your condition over time. In many cases, the initial focus will be on excluding possible conditions that could be causing the pain. Depending on your  symptoms, your health care provider may order tests to diagnose your condition. Some of these tests may include:  °· Blood tests.   °· CT scan.   °· MRI.   °· X-rays.   °· Ultrasounds.   °· Nerve conduction studies.   °You may need to see a specialist.  °TREATMENT  °Finding treatment that works well may take time. You may be referred to a pain specialist. He or she may prescribe medicine or therapies, such as:  °· Mindful meditation or yoga. °· Shots (injections) of numbing or pain-relieving medicines into the spine or area of pain. °· Local electrical stimulation. °· Acupuncture.   °· Massage therapy.   °· Aroma, color, light, or sound therapy.   °· Biofeedback.   °· Working with a physical therapist to keep from getting stiff.   °· Regular, gentle exercise.   °· Cognitive or behavioral therapy.   °· Group support.   °Sometimes, surgery may be recommended.  °HOME CARE INSTRUCTIONS  °· Take all medicines as directed by your health care provider.   °· Lessen stress in your life by relaxing and doing things such as listening to calming music.   °· Exercise or be active as directed by your health care provider.   °· Eat a healthy diet and include things such as vegetables, fruits, fish, and lean meats in your diet.   °· Keep all follow-up appointments with your health care provider.   °·   Attend a support group with others suffering from chronic pain. °SEEK MEDICAL CARE IF:  °· Your pain gets worse.   °· You develop a new pain that was not there before.   °· You cannot tolerate medicines given to you by your health care provider.   °· You have new symptoms since your last visit with your health care provider.   °SEEK IMMEDIATE MEDICAL CARE IF:  °· You feel weak.   °· You have decreased sensation or numbness.   °· You lose control of bowel or bladder function.   °· Your pain suddenly gets much worse.   °· You develop shaking. °· You develop chills. °· You develop confusion. °· You develop chest pain. °· You develop  shortness of breath.   °MAKE SURE YOU: °· Understand these instructions. °· Will watch your condition. °· Will get help right away if you are not doing well or get worse. °Document Released: 03/18/2002 Document Revised: 02/26/2013 Document Reviewed: 12/20/2012 °ExitCare® Patient Information ©2015 [employer]. This information is not intended to replace advice given to you by your health care provider. Make sure you discuss any questions you have with your health care provider. ° °
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
## 2014-01-25 NOTE — ED Notes (Signed)
Patient is alert and oriented x3.  She was given DC instructions and follow up visit instructions.  Patient gave verbal understanding. She was DC ambulatory under her own power to home.  V/S stable.  He was not showing any signs of distress on DC 

## 2014-01-27 ENCOUNTER — Emergency Department (HOSPITAL_COMMUNITY)
Admission: EM | Admit: 2014-01-27 | Discharge: 2014-01-27 | Disposition: A | Discharge status: Home / Self Care | Payer: MEDICAID | Source: Home / Self Care | Attending: Emergency Medicine | Admitting: Emergency Medicine

## 2014-01-27 ENCOUNTER — Encounter (HOSPITAL_COMMUNITY): Payer: Self-pay | Admitting: Emergency Medicine

## 2014-01-27 DIAGNOSIS — Z765 Malingerer [conscious simulation]: Secondary | ICD-10-CM

## 2014-01-27 DIAGNOSIS — F329 Major depressive disorder, single episode, unspecified: Secondary | ICD-10-CM | POA: Insufficient documentation

## 2014-01-27 DIAGNOSIS — F1193 Opioid use, unspecified with withdrawal: Secondary | ICD-10-CM

## 2014-01-27 DIAGNOSIS — Z8719 Personal history of other diseases of the digestive system: Secondary | ICD-10-CM

## 2014-01-27 DIAGNOSIS — Z5900 Homelessness unspecified: Secondary | ICD-10-CM | POA: Insufficient documentation

## 2014-01-27 DIAGNOSIS — F1210 Cannabis abuse, uncomplicated: Secondary | ICD-10-CM | POA: Insufficient documentation

## 2014-01-27 DIAGNOSIS — Z79899 Other long term (current) drug therapy: Secondary | ICD-10-CM

## 2014-01-27 DIAGNOSIS — E876 Hypokalemia: Secondary | ICD-10-CM

## 2014-01-27 DIAGNOSIS — Z87448 Personal history of other diseases of urinary system: Secondary | ICD-10-CM | POA: Insufficient documentation

## 2014-01-27 DIAGNOSIS — F1123 Opioid dependence with withdrawal: Secondary | ICD-10-CM

## 2014-01-27 DIAGNOSIS — Z91040 Latex allergy status: Secondary | ICD-10-CM | POA: Insufficient documentation

## 2014-01-27 DIAGNOSIS — I10 Essential (primary) hypertension: Secondary | ICD-10-CM | POA: Insufficient documentation

## 2014-01-27 DIAGNOSIS — F1410 Cocaine abuse, uncomplicated: Secondary | ICD-10-CM | POA: Insufficient documentation

## 2014-01-27 DIAGNOSIS — F411 Generalized anxiety disorder: Secondary | ICD-10-CM | POA: Insufficient documentation

## 2014-01-27 DIAGNOSIS — R112 Nausea with vomiting, unspecified: Secondary | ICD-10-CM

## 2014-01-27 DIAGNOSIS — Z8742 Personal history of other diseases of the female genital tract: Secondary | ICD-10-CM | POA: Diagnosis not present

## 2014-01-27 DIAGNOSIS — Z3202 Encounter for pregnancy test, result negative: Secondary | ICD-10-CM | POA: Diagnosis not present

## 2014-01-27 DIAGNOSIS — F1110 Opioid abuse, uncomplicated: Secondary | ICD-10-CM | POA: Insufficient documentation

## 2014-01-27 DIAGNOSIS — G8929 Other chronic pain: Secondary | ICD-10-CM | POA: Insufficient documentation

## 2014-01-27 DIAGNOSIS — F19939 Other psychoactive substance use, unspecified with withdrawal, unspecified: Secondary | ICD-10-CM

## 2014-01-27 DIAGNOSIS — F17200 Nicotine dependence, unspecified, uncomplicated: Secondary | ICD-10-CM | POA: Insufficient documentation

## 2014-01-27 DIAGNOSIS — F3289 Other specified depressive episodes: Secondary | ICD-10-CM | POA: Insufficient documentation

## 2014-01-27 DIAGNOSIS — E860 Dehydration: Secondary | ICD-10-CM | POA: Insufficient documentation

## 2014-01-27 HISTORY — DX: Essential (primary) hypertension: I10

## 2014-01-27 HISTORY — DX: Unspecified abdominal pain: R10.9

## 2014-01-27 HISTORY — DX: Depression, unspecified: F32.A

## 2014-01-27 HISTORY — DX: Other chronic pain: G89.29

## 2014-01-27 HISTORY — DX: Major depressive disorder, single episode, unspecified: F32.9

## 2014-01-27 HISTORY — DX: Gastro-esophageal reflux disease without esophagitis: K21.9

## 2014-01-27 HISTORY — DX: Malingerer (conscious simulation): Z76.5

## 2014-01-27 LAB — COMPREHENSIVE METABOLIC PANEL
ALT: 10 U/L (ref 0–35)
ANION GAP: 19 — AB (ref 5–15)
AST: 12 U/L (ref 0–37)
Albumin: 4.5 g/dL (ref 3.5–5.2)
Alkaline Phosphatase: 74 U/L (ref 39–117)
BUN: 8 mg/dL (ref 6–23)
CALCIUM: 9.5 mg/dL (ref 8.4–10.5)
CO2: 26 mEq/L (ref 19–32)
Chloride: 97 mEq/L (ref 96–112)
Creatinine, Ser: 0.58 mg/dL (ref 0.50–1.10)
GLUCOSE: 112 mg/dL — AB (ref 70–99)
Potassium: 2.8 mEq/L — CL (ref 3.7–5.3)
SODIUM: 142 meq/L (ref 137–147)
Total Bilirubin: 0.6 mg/dL (ref 0.3–1.2)
Total Protein: 7.7 g/dL (ref 6.0–8.3)

## 2014-01-27 LAB — CBC WITH DIFFERENTIAL/PLATELET
Basophils Absolute: 0 10*3/uL (ref 0.0–0.1)
Basophils Relative: 0 % (ref 0–1)
EOS PCT: 0 % (ref 0–5)
Eosinophils Absolute: 0 10*3/uL (ref 0.0–0.7)
HEMATOCRIT: 41.9 % (ref 36.0–46.0)
Hemoglobin: 14.6 g/dL (ref 12.0–15.0)
LYMPHS ABS: 2.9 10*3/uL (ref 0.7–4.0)
Lymphocytes Relative: 26 % (ref 12–46)
MCH: 30.4 pg (ref 26.0–34.0)
MCHC: 34.8 g/dL (ref 30.0–36.0)
MCV: 87.1 fL (ref 78.0–100.0)
Monocytes Absolute: 0.8 10*3/uL (ref 0.1–1.0)
Monocytes Relative: 7 % (ref 3–12)
Neutro Abs: 7.4 10*3/uL (ref 1.7–7.7)
Neutrophils Relative %: 67 % (ref 43–77)
PLATELETS: 318 10*3/uL (ref 150–400)
RBC: 4.81 MIL/uL (ref 3.87–5.11)
RDW: 13.1 % (ref 11.5–15.5)
WBC: 11.1 10*3/uL — AB (ref 4.0–10.5)

## 2014-01-27 LAB — URINE MICROSCOPIC-ADD ON

## 2014-01-27 LAB — RAPID URINE DRUG SCREEN, HOSP PERFORMED
Amphetamines: NOT DETECTED
Barbiturates: NOT DETECTED
Benzodiazepines: NOT DETECTED
COCAINE: POSITIVE — AB
OPIATES: POSITIVE — AB
TETRAHYDROCANNABINOL: POSITIVE — AB

## 2014-01-27 LAB — URINALYSIS, ROUTINE W REFLEX MICROSCOPIC
Glucose, UA: NEGATIVE mg/dL
HGB URINE DIPSTICK: NEGATIVE
Ketones, ur: 15 mg/dL — AB
Nitrite: NEGATIVE
Protein, ur: 100 mg/dL — AB
Specific Gravity, Urine: 1.026 (ref 1.005–1.030)
UROBILINOGEN UA: 1 mg/dL (ref 0.0–1.0)
pH: 6 (ref 5.0–8.0)

## 2014-01-27 MED ORDER — SODIUM CHLORIDE 0.9 % IV SOLN
1000.0000 mL | Freq: Once | INTRAVENOUS | Status: AC
  Administered 2014-01-27: 1000 mL via INTRAVENOUS

## 2014-01-27 MED ORDER — POTASSIUM CHLORIDE CRYS ER 20 MEQ PO TBCR: 20.0000 meq | EXTENDED_RELEASE_TABLET | Freq: Two times a day (BID) | ORAL | Status: DC

## 2014-01-27 MED ORDER — PROMETHAZINE HCL 25 MG/ML IJ SOLN
25.0000 mg | Freq: Once | INTRAMUSCULAR | Status: AC
  Administered 2014-01-27: 25 mg via INTRAMUSCULAR
  Filled 2014-01-27: qty 1

## 2014-01-27 MED ORDER — PROMETHAZINE HCL 25 MG PO TABS: 25.0000 mg | ORAL_TABLET | Freq: Three times a day (TID) | ORAL | Status: DC | PRN

## 2014-01-27 MED ORDER — ONDANSETRON HCL 4 MG/2ML IJ SOLN
4.0000 mg | Freq: Once | INTRAMUSCULAR | Status: AC
  Administered 2014-01-27: 4 mg via INTRAVENOUS
  Filled 2014-01-27: qty 2

## 2014-01-27 MED ORDER — POTASSIUM CHLORIDE 10 MEQ/100ML IV SOLN
10.0000 meq | INTRAVENOUS | Status: AC
  Administered 2014-01-27 (×4): 10 meq via INTRAVENOUS
  Filled 2014-01-27 (×3): qty 100

## 2014-01-27 MED ORDER — POTASSIUM CHLORIDE 20 MEQ/15ML (10%) PO LIQD
40.0000 meq | Freq: Once | ORAL | Status: AC
  Administered 2014-01-27: 40 meq via ORAL
  Filled 2014-01-27: qty 30

## 2014-01-27 MED ORDER — SODIUM CHLORIDE 0.9 % IV SOLN
1000.0000 mL | INTRAVENOUS | Status: DC
Start: 2014-01-27 — End: 2014-01-27
  Administered 2014-01-27: 1000 mL via INTRAVENOUS

## 2014-01-27 MED ORDER — POTASSIUM CHLORIDE CRYS ER 20 MEQ PO TBCR: 40.0000 meq | EXTENDED_RELEASE_TABLET | Freq: Once | ORAL | Status: DC

## 2014-01-27 MED ORDER — ONDANSETRON HCL 4 MG PO TABS: 4.0000 mg | ORAL_TABLET | Freq: Three times a day (TID) | ORAL | Status: DC | PRN

## 2014-01-27 MED ORDER — KETOROLAC TROMETHAMINE 30 MG/ML IJ SOLN
30.0000 mg | Freq: Once | INTRAMUSCULAR | Status: AC
  Administered 2014-01-27: 30 mg via INTRAVENOUS
  Filled 2014-01-27: qty 1

## 2014-01-27 NOTE — Discharge Instructions (Signed)
Drink plenty of fluids. Use the nausea medications as needed. Take the potassium pills until gone. YOU NEED TO STOP USING COCAINE AND NARCOTICS!!!! You were given resources to get a local doctor and to get help with your narcotic addiction.    Chronic Pain Discharge Instructions  Emergency care providers appreciate that many patients coming to us are in severe pain and we wish to address their pain in the safest, most responsible manner.  It is important to recognize however, that the proper treatment of chronic pain differs from that of the pain of injuries and acute illnesses.  Our goal is to provide quality, safe, personalized care and we thank you for giving us the opportunity to serve you. The use of narcotics and related agents for chronic pain syndromes may lead to additional physical and psychological problems.  Nearly as many people die from prescription narcotics each year as die from car crashes.  Additionally, this risk is increased if such prescriptions are obtained from a variety of sources.  Therefore, only your primary care physician or a pain management specialist is able to safely treat such syndromes with narcotic medications long-term.    Documentation revealing such prescriptions have been sought from multiple sources may prohibit us from providing a refill or different narcotic medication.  Your name may be checked first through the [facility] [place] Controlled Substances Reporting System.  This database is a record of controlled substance medication prescriptions that the patient has received.  This has been established by [facility]  in an effort to eliminate the dangerous, and often life threatening, practice of obtaining multiple prescriptions from different medical providers.   If you have a chronic pain syndrome (i.e. chronic headaches, recurrent back or neck pain, dental pain, abdominal or pelvis pain without a specific diagnosis, or neuropathic pain such as fibromyalgia) or  recurrent visits for the same condition without an acute diagnosis, you may be treated with non-narcotics and other non-addictive medicines.  Allergic reactions or negative side effects that may be reported by a patient to such medications will not typically lead to the use of a narcotic analgesic or other controlled substance as an alternative.   Patients managing chronic pain with a personal physician should have provisions in place for breakthrough pain.  If you are in crisis, you should call your physician.  If your physician directs you to the emergency department, please have the doctor call and speak to our attending physician concerning your care.   When patients come to the Emergency Department (ED) with acute medical conditions in which the Emergency Department physician feels appropriate to prescribe narcotic or sedating pain medication, the physician will prescribe these in very limited quantities.  The amount of these medications will last only until you can see your primary care physician in his/her office.  Any patient who returns to the ED seeking refills should expect only non-narcotic pain medications.   In the event of an acute medical condition exists and the emergency physician feels it is necessary that the patient be given a narcotic or sedating medication -  a responsible adult driver should be present in the room prior to the medication being given by the nurse.   Prescriptions for narcotic or sedating medications that have been lost, stolen or expired will not be refilled in the Emergency Department.    Patients who have chronic pain may receive non-narcotic prescriptions until seen by their primary care physician.  It is every patients personal responsibility to maintain active prescriptions  with his or her primary care physician or specialist.

## 2014-01-27 NOTE — ED Notes (Signed)
Pt discharged home with all belongings, pt alert, oriented and ambulatory upon discharge, 3 new rx prescribed, pt verbalizes understanding of discharge instructions, pt escorted to waiting area via wheel chair by [employer]Leslie RN, pt took the bus home

## 2014-01-27 NOTE — ED Notes (Signed)
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

## 2014-01-27 NOTE — ED Notes (Signed)
[name], Child psychotherapistocial Worker, into room to speak with pt.

## 2014-01-27 NOTE — ED Notes (Signed)
MD at the bedside  
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
## 2014-01-27 NOTE — ED Notes (Signed)
[name], Social Work called to bring pt info about drug rehab per Dr. Delford FieldKnapp's request.
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
## 2014-01-27 NOTE — ED Notes (Signed)
Pt able to tolerate PO fluids and asked for crackers. MD made aware.

## 2014-01-28 ENCOUNTER — Emergency Department (HOSPITAL_COMMUNITY)
Admission: EM | Admit: 2014-01-28 | Discharge: 2014-01-28 | Disposition: A | Discharge status: Home / Self Care | Payer: MEDICAID | Attending: Emergency Medicine | Admitting: Emergency Medicine

## 2014-01-28 ENCOUNTER — Encounter (HOSPITAL_COMMUNITY): Payer: Self-pay | Admitting: Emergency Medicine

## 2014-01-28 DIAGNOSIS — F1910 Other psychoactive substance abuse, uncomplicated: Secondary | ICD-10-CM

## 2014-01-28 HISTORY — DX: Homelessness: Z59.0

## 2014-01-28 HISTORY — DX: Other psychoactive substance abuse, uncomplicated: F19.10

## 2014-01-28 HISTORY — DX: Cocaine abuse, uncomplicated: F14.10

## 2014-01-28 HISTORY — DX: Homelessness unspecified: Z59.00

## 2014-01-28 LAB — CBC WITH DIFFERENTIAL/PLATELET
Basophils Absolute: 0 10*3/uL (ref 0.0–0.1)
Basophils Relative: 0 % (ref 0–1)
EOS ABS: 0.1 10*3/uL (ref 0.0–0.7)
EOS PCT: 1 % (ref 0–5)
HCT: 37.1 % (ref 36.0–46.0)
Hemoglobin: 12.5 g/dL (ref 12.0–15.0)
LYMPHS ABS: 4.2 10*3/uL — AB (ref 0.7–4.0)
Lymphocytes Relative: 39 % (ref 12–46)
MCH: 29.6 pg (ref 26.0–34.0)
MCHC: 33.7 g/dL (ref 30.0–36.0)
MCV: 87.9 fL (ref 78.0–100.0)
Monocytes Absolute: 0.7 10*3/uL (ref 0.1–1.0)
Monocytes Relative: 7 % (ref 3–12)
Neutro Abs: 5.7 10*3/uL (ref 1.7–7.7)
Neutrophils Relative %: 53 % (ref 43–77)
PLATELETS: 258 10*3/uL (ref 150–400)
RBC: 4.22 MIL/uL (ref 3.87–5.11)
RDW: 13.2 % (ref 11.5–15.5)
WBC: 10.6 10*3/uL — ABNORMAL HIGH (ref 4.0–10.5)

## 2014-01-28 LAB — ETHANOL

## 2014-01-28 LAB — RAPID URINE DRUG SCREEN, HOSP PERFORMED
AMPHETAMINES: NOT DETECTED
BENZODIAZEPINES: NOT DETECTED
Barbiturates: NOT DETECTED
Cocaine: POSITIVE — AB
OPIATES: NOT DETECTED
Tetrahydrocannabinol: POSITIVE — AB

## 2014-01-28 LAB — URINALYSIS, ROUTINE W REFLEX MICROSCOPIC
Bilirubin Urine: NEGATIVE
GLUCOSE, UA: NEGATIVE mg/dL
Hgb urine dipstick: NEGATIVE
Ketones, ur: NEGATIVE mg/dL
LEUKOCYTES UA: NEGATIVE
Nitrite: NEGATIVE
PROTEIN: NEGATIVE mg/dL
Specific Gravity, Urine: 1.014 (ref 1.005–1.030)
UROBILINOGEN UA: 1 mg/dL (ref 0.0–1.0)
pH: 7.5 (ref 5.0–8.0)

## 2014-01-28 LAB — COMPREHENSIVE METABOLIC PANEL
ALT: 8 U/L (ref 0–35)
ANION GAP: 12 (ref 5–15)
AST: 11 U/L (ref 0–37)
Albumin: 3.6 g/dL (ref 3.5–5.2)
Alkaline Phosphatase: 61 U/L (ref 39–117)
BUN: 5 mg/dL — ABNORMAL LOW (ref 6–23)
CO2: 23 mEq/L (ref 19–32)
Calcium: 8.9 mg/dL (ref 8.4–10.5)
Chloride: 104 mEq/L (ref 96–112)
Creatinine, Ser: 0.56 mg/dL (ref 0.50–1.10)
GFR calc non Af Amer: >90 mL/min (ref >90)
GLUCOSE: 117 mg/dL — AB (ref 70–99)
Potassium: 3.5 mEq/L — ABNORMAL LOW (ref 3.7–5.3)
SODIUM: 139 meq/L (ref 137–147)
TOTAL PROTEIN: 6.5 g/dL (ref 6.0–8.3)
Total Bilirubin: 0.5 mg/dL (ref 0.3–1.2)

## 2014-01-28 LAB — POC URINE PREG, ED: Preg Test, Ur: NEGATIVE

## 2014-01-28 NOTE — ED Notes (Signed)
PAITENT ON THE PHONE WITH HER MOTHER. SHE IS TALKING TO HER MOTHER ABOUT NOT HAVING ANYWHERE SHE CAN GO.
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
## 2014-01-28 NOTE — ED Provider Notes (Signed)
CSN: [id number]     Arrival date & time 01/27/14  2347 History   First MD Initiated Contact with Patient 01/28/14 0256     Chief Complaint  Patient presents with  . Drug Problem     (Consider location/radiation/quality/duration/timing/severity/associated sxs/prior Treatment) HPI  Visit 28 year old woman with a history of polysubstance abuse. She states that she is homeless after having left her husband in [place] about a week ago.  The patient comes in requesting detox from prescription opiates. She takes either Percocet or oxycodone tablets which she buys on the street. On average, she takes 8-10 tablets per day with strength of tablets urinating from 5 mg to 10 mg of oxycodone. Last use was about 24 hours ago. Patient says she is having withdrawal symptoms consisting of sneezing, cold sweats and anxiety.  She says she has talked to some type of a residential detox facility [place] and is here to get a medical screening exam and to get transportation to [place] to this facility. 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 She says her one occasionally. She uses cocaine occasionally. She does not drink alcohol regularly.  Past Medical History  Diagnosis Date  . Endometriosis   . GERD (gastroesophageal reflux disease)   . Hypertension   . Depression   . Drug-seeking behavior   . Chronic abdominal pain   . Homelessness   . Cocaine abuse   . Polysubstance abuse    Past Surgical History  Procedure Laterality Date  . Partial hysterectomy  2009    left her ovaries  . Tonsillectomy     History reviewed. No pertinent family history. History  Substance Use Topics  . Smoking status: Current Every Day Smoker    Types: Cigarettes  . Smokeless tobacco: Never Used  . Alcohol Use: No   OB History   Grav Para Term Preterm Abortions TAB SAB Ect Mult Living   2 2 2  0 0 0 0 0 0 2     Review of Systems  Ten point review of symptoms  performed and is negative with the exception of symptoms noted above.   Allergies  Contrast media; Reglan; and Latex  Home Medications   Prior to Admission medications   Medication Sig Start Date End Date Taking? Authorizing Provider  gabapentin (NEURONTIN) 100 MG capsule Take 100 mg by mouth at bedtime.   Yes Historical Provider, MD  oxyCODONE-acetaminophen (ROXICET) 5-325 MG per tablet Take 1-2 tablets by mouth every 6 (six) hours as needed for severe pain. 01/21/14  Yes Freddi StarrJulie N Ethier, PA-C  promethazine (PHENERGAN) 12.5 MG tablet Take 12.5 mg by mouth every 4 (four) hours as needed for nausea or vomiting.   Yes Historical Provider, MD  dicyclomine (BENTYL) 20 MG tablet Take 1 tablet (20 mg total) by mouth every 6 (six) hours as needed for spasms (for abdominal cramping). 01/25/14   Olivia Mackielga M Otter, MD  ondansetron (ZOFRAN) 4 MG tablet Take 1 tablet (4 mg total) by mouth every 8 (eight) hours as needed for nausea or vomiting. 01/27/14   Ward GivensIva L Knapp, MD  potassium chloride SA (K-DUR,KLOR-CON) 20 MEQ tablet Take 1 tablet (20 mEq total) by mouth 2 (two) times daily. 01/27/14   Ward GivensIva L Knapp, MD  promethazine (PHENERGAN) 25 MG suppository Place 1 suppository (25 mg total) rectally every 6 (six) hours as needed for nausea or vomiting. 01/25/14   Olivia Mackielga M Otter, MD  promethazine (PHENERGAN) 25 MG tablet Take 1  tablet (25 mg total) by mouth every 8 (eight) hours as needed for nausea or vomiting. 01/27/14   Ward Givens, MD   BP 111/61  Pulse 77  Temp(Src) 98.1 F (36.7 C)  Resp 20  Wt 117 lb 4 oz (53.184 kg)  SpO2 97% Physical Exam Gen: well developed and well nourished appearing Head: NCAT Eyes: PERL, EOMI Nose: no epistaixis or rhinorrhea Mouth/throat: mucosa is moist and pink Neck: supple, no stridor Lungs: CTA B, no wheezing, rhonchi or rales CV: RRR, no murmur, extremities appear well perfused.  Abd: soft, notender, nondistended Back: no ttp, no cva ttp Skin: warm and dry Ext: normal to  inspection, no dependent edema Neuro: CN ii-xii grossly intact, no focal deficits Psyche; normal affect,  calm and cooperative.   ED Course  Procedures (including critical care time) Labs Review  Results for orders placed during the hospital encounter of 01/28/14 (from the past 24 hour(s))  ETHANOL     Status: None   Collection Time    01/28/14 12:01 AM      Result Value Ref Range   Alcohol, Ethyl (B) <11  0 - 11 mg/dL  CBC WITH DIFFERENTIAL     Status: Abnormal   Collection Time    01/28/14 12:01 AM      Result Value Ref Range   WBC 10.6 (*) 4.0 - 10.5 K/uL   RBC 4.22  3.87 - 5.11 MIL/uL   Hemoglobin 12.5  12.0 - 15.0 g/dL   HCT 12.4  58.0 - 99.8 %   MCV 87.9  78.0 - 100.0 fL   MCH 29.6  26.0 - 34.0 pg   MCHC 33.7  30.0 - 36.0 g/dL   RDW 33.8  25.0 - 53.9 %   Platelets 258  150 - 400 K/uL   Neutrophils Relative % 53  43 - 77 %   Neutro Abs 5.7  1.7 - 7.7 K/uL   Lymphocytes Relative 39  12 - 46 %   Lymphs Abs 4.2 (*) 0.7 - 4.0 K/uL   Monocytes Relative 7  3 - 12 %   Monocytes Absolute 0.7  0.1 - 1.0 K/uL   Eosinophils Relative 1  0 - 5 %   Eosinophils Absolute 0.1  0.0 - 0.7 K/uL   Basophils Relative 0  0 - 1 %   Basophils Absolute 0.0  0.0 - 0.1 K/uL  COMPREHENSIVE METABOLIC PANEL     Status: Abnormal   Collection Time    01/28/14 12:01 AM      Result Value Ref Range   Sodium 139  137 - 147 mEq/L   Potassium 3.5 (*) 3.7 - 5.3 mEq/L   Chloride 104  96 - 112 mEq/L   CO2 23  19 - 32 mEq/L   Glucose, Bld 117 (*) 70 - 99 mg/dL   BUN 5 (*) 6 - 23 mg/dL   Creatinine, Ser 7.67  0.50 - 1.10 mg/dL   Calcium 8.9  8.4 - 34.1 mg/dL   Total Protein 6.5  6.0 - 8.3 g/dL   Albumin 3.6  3.5 - 5.2 g/dL   AST 11  0 - 37 U/L   ALT 8  0 - 35 U/L   Alkaline Phosphatase 61  39 - 117 U/L   Total Bilirubin 0.5  0.3 - 1.2 mg/dL   GFR calc non Af Amer >90  >90 mL/min   GFR calc Af Amer >90  >90 mL/min   Anion gap 12  5 - 15  MDM  The patient has been medically cleared and we  are awaiting assistance with her placement from the [facility]. She will be moved to pod C her disposition is pending.   Brandt Loosen, MD 01/28/14 [phone]

## 2014-01-28 NOTE — ED Notes (Addendum)
Paper scrubs given to pt. at triage , clothes placed in plastic belonging bags and labelled. Security notified to wand pt.
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
## 2014-01-28 NOTE — BH Assessment (Signed)
Asked for TA equipment to be set up.   Spoke with EDP Dr. Lavella LemonsManly, reports Pt is her for detox. Urine drug screen has not been collected. Pt was negative for alcohol. Dr. Lavella LemonsManly will put a note to nursing to collect drug screen.   TA to commence shortly.   Clista BernhardtNancy Ladell Bey, [facility] Triage Specialist 01/28/2014 5:44 AM

## 2014-01-28 NOTE — BH Assessment (Signed)
Relayed information from TA to The PNC FinancialSpencer Simon, [place]. Per Karleen HampshireSpencer PA, pt does not meet inpt criteria, and can be discharged with out-patient resources if she is medically cleared.   Called to relay information to Dr. Lavella LemonsManly, who was held up. Cassie, will relay information to Dr. Lavella LemonsManly to determine if she is in agreement with this plan.   Clista BernhardtNancy Mamoudou Mulvehill, [facility] Triage Specialist 01/28/2014 6:18 AM

## 2014-01-28 NOTE — ED Provider Notes (Signed)
  Physical Exam  BP 143/98  Pulse 78  Temp(Src) 98.1 F (36.7 C) (Oral)  Resp 18  Wt 117 lb 4 oz (53.184 kg)  SpO2 96%  Physical Exam  ED Course  Procedures  MDM Patient's been medically cleared. Several other places that she would be placed cannot take her because of her Medicaid. Patient be discharged and has followup resources      Juliet Rudeathan R. Rubin PayorPickering, MD 01/28/14 1106
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
## 2014-01-28 NOTE — ED Notes (Signed)
Patient reports she has been using around 10 oxycodone 10/325 tablets daily. States the last time she used them was Saturday morning around 9 am. States she only had 3 pills and ran out. States her current symptoms started on Sunday morning (sweating, nausea, weak feeling, chills, and sensation that her skin is crawling). States she has had opiate withdrawal before. States the last time was "about this time last year" when she entered [place] treatment program in [place]. States she was able to stay clean for  About 4 months.

## 2014-01-28 NOTE — ED Notes (Signed)
SPOKE TO DR Rubin PayorPICKERING ABOUT REVIEWING PATIENT AND MAKING RECCOMENDATIONS FOR DISPO.

## 2014-01-28 NOTE — ED Notes (Signed)
[facility]ed [facility] and spoke to Moraviamelissa. She advises that [facility] can call [facility] daily and see if treatment bed opens up there. She advises that they do have a waiting list for treatment beds. Efraim KaufmannMelissa is aware [place].

## 2014-01-28 NOTE — ED Notes (Addendum)
Pt. States last use of cocaine and pills was this past Saturday. States that she has been [place] then relapsed because she was dx. With endometriosis and had to have surgery and could not be treated there for it. She also states she was sober while she was pregnant with her child.

## 2014-01-28 NOTE — Progress Notes (Signed)
CM contacted Medicaid Customer Service, [phone], regarding patient Medicaid status and confirm county in which services are active. This CM was informed that the patient [place]. This CM informed the C pod staff nurse Annettte regarding patient county of services to be connected to outpatient resources. Ferdinand CavaAndrea Schettino, RN, BSN, Case Managers 01/28/2014 8:43 AM

## 2014-01-29 ENCOUNTER — Ambulatory Visit: Payer: Medicaid Other | Admitting: Obstetrics & Gynecology

## 2014-01-29 ENCOUNTER — Telehealth: Payer: Self-pay

## 2014-01-29 NOTE — Telephone Encounter (Signed)
Called pt and her mother picked up the phone I stated to pts mother that I was calling concerning her appt today.  Pts mother informed me that pt is "no longer residing in [place], she is in a treatment facility".  I stated that I understand and thanked her for her time.

## 2014-05-11 ENCOUNTER — Encounter (HOSPITAL_COMMUNITY): Payer: Self-pay | Admitting: Emergency Medicine

## 2016-04-28 ENCOUNTER — Inpatient Hospital Stay: Admit: 2016-04-28 | Discharge: 2016-04-28 | Disposition: A | Discharge status: Home / Self Care

## 2016-04-28 DIAGNOSIS — S39012A Strain of muscle, fascia and tendon of lower back, initial encounter: Secondary | ICD-10-CM

## 2016-04-28 MED ORDER — KETOROLAC TROMETHAMINE 30 MG/ML IJ SOLN
30 MG/ML | Freq: Once | INTRAMUSCULAR | Status: AC
Start: 2016-04-28 — End: 2016-04-28
  Administered 2016-04-28: 18:00:00 30 mg via INTRAMUSCULAR

## 2016-04-28 MED ORDER — METHOCARBAMOL 500 MG PO TABS
500 MG | ORAL_TABLET | Freq: Three times a day (TID) | ORAL | 0 refills | Status: AC
Start: 2016-04-28 — End: 2016-05-03

## 2016-04-28 MED ORDER — NAPROXEN 375 MG PO TABS
375 MG | ORAL_TABLET | Freq: Two times a day (BID) | ORAL | 0 refills | Status: AC
Start: 2016-04-28 — End: 2016-05-08

## 2016-04-28 MED ORDER — ORPHENADRINE CITRATE 30 MG/ML IJ SOLN
30 MG/ML | Freq: Once | INTRAMUSCULAR | Status: AC
Start: 2016-04-28 — End: 2016-04-28
  Administered 2016-04-28: 18:00:00 60 mg via INTRAMUSCULAR

## 2016-04-28 MED FILL — KETOROLAC TROMETHAMINE 30 MG/ML IJ SOLN: 30 MG/ML | INTRAMUSCULAR | Qty: 1

## 2016-04-28 MED FILL — ORPHENADRINE CITRATE 30 MG/ML IJ SOLN: 30 MG/ML | INTRAMUSCULAR | Qty: 2

## 2016-04-28 NOTE — ED Provider Notes (Signed)
Independent practitioner evaluation          [facility] ED  eMERGENCY dEPARTMENT eNCOUnter        Pt Name: Meghan Browning  MRN: [id number]  Birthdate 01-19-1986  Date of evaluation: 04/28/2016  Provider: Lethea Killings, NP  PCP: No primary care provider on file.  ED Attending: No att. providers found    CHIEF COMPLAINT       Chief Complaint   Patient presents with   ??? Back Pain     pt c/o back pain from moving cases of water and pop yesterday at work.       HISTORY OF PRESENT ILLNESS   (Location/Symptom, Timing/Onset, Context/Setting, Quality, Duration, Modifying Factors, Severity)  Note limiting factors.     Meghan Browning is a 30 y.o. female for low back pain. Onset was yesterday. Duration has been since the onset. 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 Alleviating factors include rest.  Aggravating factors include movement. Pain is 5/10. Nothing has been used for pain today.    Nursing Notes were all reviewed and agreed with or any disagreements were addressed  in the HPI.    REVIEW OF SYSTEMS    (2-9 systems for level 4, 10 or more for level 5)     Review of Systems   Constitutional: Negative for fever.   Respiratory: Negative for shortness of breath.    Cardiovascular: Negative for chest pain.   Gastrointestinal: Negative for abdominal pain.   Genitourinary: Negative for difficulty urinating.   Musculoskeletal: Positive for back pain.   All other systems reviewed and are negative.      Positives and Pertinent negatives as per HPI.  Except as noted above in the ROS, all other systems were reviewed and negative.       PAST MEDICAL HISTORY     Past Medical History:   Diagnosis Date   ??? Cancer (HCC)     cervical and uterine         SURGICAL HISTORY       Past Surgical History:   Procedure Laterality Date   ??? HYSTERECTOMY           CURRENT MEDICATIONS       Previous Medications    No  medications on file         ALLERGIES     Latex and Red dye    FAMILY HISTORY     History reviewed. No pertinent family history.       SOCIAL HISTORY       Social History     Social History   ??? Marital status: N/A     Spouse name: N/A   ??? Number of children: N/A   ??? Years of education: N/A     Social History Main Topics   ??? Smoking status: Current Every Day Smoker     Types: Cigarettes   ??? Smokeless tobacco: Never Used   ??? Alcohol use No   ??? Drug use: No   ??? Sexual activity: Not Asked     Other Topics Concern   ??? None     Social History Narrative   ??? None       SCREENINGS             PHYSICAL EXAM    (up to 7 for level 4, 8 or more for level  5)     ED Triage Vitals [04/28/16 1244]   BP Temp Temp Source Pulse Resp SpO2 Height Weight   (!) 127/99 98.1 ??F (36.7 ??C) Oral 90 18 97 % -- --       Physical Exam   Constitutional: She is oriented to person, place, and time. She appears well-developed and well-nourished.   HENT:   Head: Normocephalic and atraumatic.   Neck: Normal range of motion.   Cardiovascular: Normal rate.    Pulmonary/Chest: Effort normal. No respiratory distress.   Abdominal: Soft. She exhibits no distension.   Musculoskeletal: She exhibits tenderness.   Decreased range of motion due to pain elicited with movement back.  Tenderness with palpation to the paraspinal lumbar region.  Patient describes bilateral radiculopathy.  Patient denies any caudal anesthesia.  Sensation strength and pulses intact to lower extremities.  Deep tendon reflexes intact.   Neurological: She is alert and oriented to person, place, and time.   Skin: Skin is warm and dry.   Psychiatric: She has a normal mood and affect.       DIAGNOSTIC RESULTS   LABS:    Labs Reviewed - No data to display    All other labs were within normal range or not returned as of this dictation.    EKG: All EKG's are interpreted by the Emergency Department Physician who either signs or Co-signs this chart in the absence of a cardiologist.  Please see  their note for interpretation of EKG.      RADIOLOGY:   Non-plain film images such as CT, Ultrasound and MRI are read by the radiologist. Plain radiographic images are visualized and preliminarily interpreted by the  ED Provider with the below findings:        Interpretation per the Radiologist below, if available at the time of this note:    No orders to display     No results found.      PROCEDURES   Unless otherwise noted below, none     Procedures    CRITICAL CARE TIME   N/A    CONSULTS:  None      EMERGENCY DEPARTMENT COURSE and DIFFERENTIAL DIAGNOSIS/MDM:   Vitals:    Vitals:    04/28/16 1244   BP: (!) 127/99   Pulse: 90   Resp: 18   Temp: 98.1 ??F (36.7 ??C)   TempSrc: Oral   SpO2: 97%       Patient was given the following medications:  Medications   orphenadrine (NORFLEX) injection 60 mg (60 mg Intramuscular Given 04/28/16 1335)   ketorolac (TORADOL) injection 30 mg (30 mg Intramuscular Given 04/28/16 1336)       Patient was seen and evaluated by myself.  Patient here for lumbar strain that she stated started yesterday after she was working.  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  She developed lower back pain yesterday.  On exam she is awake and alert hemodynamically stable nontoxic in appearance.  She is neurologically intact her lower extremities.  Patient was given a dose of Norflex and Toradol here in the ED.  Physical therapy was consult for evaluation.  She'll be discharged home with Robaxin and Naprosyn.  She was encouraged to follow up and establish care with a PCP that was provided for her.  She was also encouraged to return the ED for any worsening symptoms.  Patient was discharged with all questions answered.  The patient tolerated their visit well.  They were seen and evaluated by the attending physician, No att. providers found who agreed with the assessment and plan.  The patient and / or the family were informed of the  results of any tests, a time was given to answer questions, a plan was proposed and they agreed with plan.        FINAL IMPRESSION      1. Strain of lumbar region, initial encounter          DISPOSITION/PLAN   DISPOSITION Decision to Discharge    PATIENT REFERRED TO:  [facility]  [address]  [place] [address]  [phone]  Schedule an appointment as soon as possible for a visit in 1 week  to establish primary care    [facility] ED  [address]  [place] [address]  [phone]    If symptoms worsen      DISCHARGE MEDICATIONS:  New Prescriptions    METHOCARBAMOL (ROBAXIN) 500 MG TABLET    Take 1 tablet by mouth 3 times daily for 5 days    NAPROXEN (NAPROSYN) 375 MG TABLET    Take 1 tablet by mouth 2 times daily for 10 days       DISCONTINUED MEDICATIONS:  Discontinued Medications    No medications on file              (Please note that portions of this note were completed with a voice recognition program.  Efforts were made to edit the dictations but occasionally words are mis-transcribed.)    Lethea Killings, NP (electronically signed)       Lethea Killings, NP  04/28/16 1349

## 2016-04-28 NOTE — ED Notes (Signed)
Discussed discharge and follow up with the patient. Medications discussed.  Patient verbalized understanding, denies any questions or concerns at this time. Patient ambulated independently out of the ER.     Rockney GheeAnna Vear Staton, RN  04/28/16 [phone]

## 2016-04-28 NOTE — ED Notes (Signed)
PT at bedside.      Rockney GheeAnna Luverne Zerkle, RN  04/28/16 [phone]

## 2016-04-28 NOTE — Progress Notes (Signed)
LUMBOSACRAL PHYSICAL THERAPY EVALUATION    Received referral for  Physical Therapy evaluation in the Emergency Department.  Pt educated to role of PT in the ED, and pt agreeable to proceed with evaluation.    Evaluation Date: 04/28/2016        Patient Name: Meghan Browning      Date of Birth:  14-Oct-1985       Medical Diagnosis:  Back pain     ICD 10:  Treatment Diagnosis:  L SI dysfunction, pain    Onset Date:  04/27/16      Referral Date: 04/28/16      Referring Physician:  Salomon Mast, NP        Visits Allowed/Insurance/Certification Information:      Restrictions/Precautions:  none    Pt's Occupation/Job Duties:    Works full time    Social support/Environment:   Currently lives with two kids in an apt with 2 flights of stairs to enter    Health History reviewed with pt:  yes      SUBJECTIVE FINDINGS       History of Present Illness:     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 Pt does not know what the defect is exactly. 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 Pt reports she immediately felt pain immediately in her low back, mostly localized to L side.     Pt reports that this happens about once per year and she usually has to rest and take meds for 1-2 weeks before she can return to work.     Pt has not had any PT to address back pain.    Pain       Patient describes pain to be sharp pain with movement and dull and achy  Patient reports 4/10 pain at rest, 8/10 pain with activity.   Worsened by bending, lifting, any movement, twisting  Improved by rest, lying down  Pt. reports pain with coughing, sneezing and laughing: YES  Pt. reports bowel and bladder changes:  NO   Current Functional Limitations: working, lifting  PLOF:  Independent all ADLs  Pt's sleep is affected?  Woke up 2x last night from discomfort and stiffness    OBJECTIVE FINDINGS       Posture         L PSIS lower than R PSIS in static  stance    Gait/Steps/Balance       Deviations on a level linoleum surface include dec stance time LLE, dec cadence, dec arm swing B with minimal trunk rotation, dec hip ext B    Quick Tests          SI Tests          Toe Walk (S2): able to complete     March Test: positive  Heel Walk (L5): able to complete     Forward Flexion Test: unable to perform 2/2 pain    Lumbar Range of Motion/Strength Testing     ROM (*denotes pain) AROM PROM MMT/RESISTED  COMMENTS   Flexion 25% normal range - limited by pain                        Extension 25% normal range - limited by pain      Sidebending Left 25% normal range - limited by pain      Sidebending Right 50% normal  range - limited by pain      Rotation Left 25% normal range - limited by pain      Rotation Right 50% normal range - limited by pain         Sensation/Motor Function         Dermatome Left Right Myotome Left Right   Anterior Groin (L1-L2) intact intact Hip Flexion (L1-L2)  3+/5  4/5   Anterior Thigh, Medial Knee (L3) intact intact Knee Extension (L3)  4/5  4+/5   Anterior Knee, Medial Lower Leg, Medial Toe (L4) intact intact Ankle Dorsiflexion (L4)  4+/5 4+ /5   Lateral Leg, Dorsum of Foot, Web space digits 1 & 2 (L5) intact intact Great Toe Extension (L5)  /5  /5   Posterior Lateral Calf, Lateral Toe (S1) intact intact Ankle Plantarflexion (S1)  5/5  5/5   Posterior Medial Calf, Medial Heel (S2) intact intact Knee Flexion (S1-S2)  4/5  4+/5       Strength/Reflexes     Reflex Left Right Strength Strength   Quadriceps (L3-L4) 2+ 2+ Trunk Extensors    Achilles (S1-S2)   Gluteals       Abdominals       Hip Abduction  3/5 L  3+/5 R      Hip Adduction       Flexibility     Hamstrings:   Tight B       Gastrocs:  Tight B    Palpation     Patient reported mod tenderness with palpation over lumbar paraspinals (L>R) and severe tenderness with palpation over L SI joint   No noted warmth   No noted increased muscle tone     Special Tests     Lumbar Special Test Findings SI  Special Test Findings   Straight Leg Raise neg Sit up Test positive   Crams  90/90 Test    Dural Tension  Oscillation    Distraction neg Ant/Post Rot Prov    Compression neg Other      Functional Outcome Measure    Pre-tx:  Measure used:  Pain Scale  Score:  5/10  % Disability:  50%    Post-tx:  Measure used:  Pain Scale  Score:  2/10  % Disability:  20%    TREATMENT  HEP Issued and pt demonstrated understanding of exercises  Knee [place]ocks x20  TA sets x10  Bridging x10  B knee to chest stretch - verbally reviewed    Manual therapy: MET correction of L post pelvic rotation and L sacral torsion. Pt tolerated well, pain dec form 5/10 to 2/10.    ASSESSMENT       GCode:  Mobility Current G8978 CK  Mobility Goal G8979 CJ  Mobility Discharge [phone] CJ    Problems       ??? Decreased trunk ROM  ??? Decreased trunk strength  ??? Decreased LE strength  ??? Decreased joint mobility  ??? Increased pain  ??? Decreased flexibility  ??? Decreased functional status    PLAN OF CARE     One time visit in ED.    Pt is a 30 y/o F presenting to the ED with c/o LBP of sudden onset. Through evaluation and examination, identified pt to have L post pelvic rotation and sacral torsion. Corrected with MET. Educated on anatomy, physiology, and biomechanics of lumbar spine, SI joint, and proper pelvic alignment. Pt verbalized understanding and all of patient's questions answered to satisfaction.  Pt responded well with  a dec in pain from 5/10 to 2/10. Issued HEP and pt demonstrated understanding of exercises. Recommend pt follow-up with outpatient PT next week if high pain levels persist. No DME needs.     Thank you for the referral of this patient.      13:40 - 14:20  Timed Code Treatment Minutes:  25    minutes      Total Treatment Time:   Bertie Tanav Orsak, PT, DPT # [phone]

## 2016-05-03 ENCOUNTER — Inpatient Hospital Stay

## 2020-03-22 ENCOUNTER — Ambulatory Visit
Admission: EM | Admit: 2020-03-22 | Discharge: 2020-03-22 | Disposition: A | Discharge status: Home / Self Care | Payer: Self-pay | Attending: Emergency Medicine | Admitting: Emergency Medicine

## 2020-03-22 DIAGNOSIS — L03031 Cellulitis of right toe: Secondary | ICD-10-CM

## 2020-03-22 MED ORDER — DOXYCYCLINE HYCLATE 100 MG PO CAPS: 100.0000 mg | ORAL_CAPSULE | Freq: Two times a day (BID) | ORAL | 0 refills | Status: AC

## 2020-03-22 NOTE — ED Provider Notes (Signed)
Renaldo Fiddler    CSN: [id number] Arrival date & time: 03/22/20  1000      History   Chief Complaint Chief Complaint  Patient presents with  . Nail Problem    HPI [name] is a 34 y.o. female  Presenting for right great toe pain, swelling.  States that she noticed yellow pus come out of it in the time last night-"mashed it together".  Requesting work note.  Past Medical History:  Diagnosis Date  . Chronic abdominal pain   . Cocaine abuse (HCC)   . Depression   . Drug-seeking behavior   . Endometriosis   . GERD (gastroesophageal reflux disease)   . Homelessness   . Hypertension   . Polysubstance abuse [facility])     Patient Active Problem List   Diagnosis Date Noted  . Drug-seeking behavior 01/21/2014  . DEPRESSION 05/05/2009  . HYPERTENSION 05/05/2009  . GERD 05/05/2009  . ABDOMINAL PAIN, CHRONIC 05/05/2009    Past Surgical History:  Procedure Laterality Date  . PARTIAL HYSTERECTOMY  2009   left her ovaries  . TONSILLECTOMY      OB History    Gravida  2   Para  2   Term  2   Preterm  0   AB  0   Living  2     SAB  0   TAB  0   Ectopic  0   Multiple  0   Live Births  2            Home Medications    Prior to Admission medications   Medication Sig Start Date End Date Taking? Authorizing Provider  dicyclomine (BENTYL) 20 MG tablet Take 1 tablet (20 mg total) by mouth every 6 (six) hours as needed for spasms (for abdominal cramping). 01/25/14   Marisa Severin, MD  doxycycline (VIBRAMYCIN) 100 MG capsule Take 1 capsule (100 mg total) by mouth 2 (two) times daily for 7 days. 03/22/20 03/29/20  Hall-Potvin, [place], PA-C  gabapentin (NEURONTIN) 100 MG capsule Take 100 mg by mouth at bedtime.    [provider]  ondansetron (ZOFRAN) 4 MG tablet Take 1 tablet (4 mg total) by mouth every 8 (eight) hours as needed for nausea or vomiting. 01/27/14   Devoria Albe, MD  oxyCODONE-acetaminophen (ROXICET) 5-325 MG per tablet Take 1-2  tablets by mouth every 6 (six) hours as needed for severe pain. 01/21/14   Marny Lowenstein, PA-C  potassium chloride SA (K-DUR,KLOR-CON) 20 MEQ tablet Take 1 tablet (20 mEq total) by mouth 2 (two) times daily. 01/27/14   Devoria Albe, MD  promethazine (PHENERGAN) 12.5 MG tablet Take 12.5 mg by mouth every 4 (four) hours as needed for nausea or vomiting.    [provider]  promethazine (PHENERGAN) 25 MG suppository Place 1 suppository (25 mg total) rectally every 6 (six) hours as needed for nausea or vomiting. 01/25/14   Marisa Severin, MD  promethazine (PHENERGAN) 25 MG tablet Take 1 tablet (25 mg total) by mouth every 8 (eight) hours as needed for nausea or vomiting. 01/27/14   Devoria Albe, MD    Family History History reviewed. No pertinent family history.  Social History Social History   Tobacco Use  . Smoking status: Current Every Day Smoker    Types: Cigarettes  . Smokeless tobacco: Never Used  Substance Use Topics  . Alcohol use: No  . Drug use: Yes    Types: Marijuana    Comment: Narcotics  Allergies   Contrast media [iodinated diagnostic agents], Reglan [metoclopramide], and Latex   Review of Systems As per HPI   Physical Exam Triage Vital Signs ED Triage Vitals  Enc Vitals Group     BP 03/22/20 1148 117/77     Pulse Rate 03/22/20 1148 63     Resp 03/22/20 1148 14     Temp 03/22/20 1148 99 F (37.2 C)     Temp src --      SpO2 03/22/20 1148 97 %     Weight --      Height --      Head Circumference --      Peak Flow --      Pain Score 03/22/20 1146 6     Pain Loc --      Pain Edu? --      Excl. in GC? --    No data found.  Updated Vital Signs BP 117/77   Pulse 63   Temp 99 F (37.2 C)   Resp 14   SpO2 97%   Visual Acuity Right Eye Distance:   Left Eye Distance:   Bilateral Distance:    Right Eye Near:   Left Eye Near:    Bilateral Near:     Physical Exam Constitutional:      General: She is not in acute distress. HENT:     Head:  Normocephalic and atraumatic.  Eyes:     General: No scleral icterus.    Pupils: Pupils are equal, round, and reactive to light.  Cardiovascular:     Rate and Rhythm: Normal rate.  Pulmonary:     Effort: Pulmonary effort is normal.  Musculoskeletal:        General: Swelling and tenderness present. Normal range of motion.     Comments: R great toe  Skin:    Coloration: Skin is not jaundiced or pale.  Neurological:     Mental Status: She is alert and oriented to person, place, and time.      UC Treatments / Results  Labs (all labs ordered are listed, but only abnormal results are displayed) Labs Reviewed - No data to display  EKG   Radiology No results found.  Procedures Procedures (including critical care time)  Medications Ordered in UC Medications - No data to display  Initial Impression / Assessment and Plan / UC Course  I have reviewed the triage vital signs and the nursing notes.  Pertinent labs & imaging results that were available during my care of the patient were reviewed by me and considered in my medical decision making (see chart for details).     Patient afebrile, nontoxic, without deformity.  No fluctuance, though given extent of toe swelling will start doxycycline, follow-up with podiatry as needed.  Return precautions discussed, pt verbalized understanding and is agreeable to plan. Final Clinical Impressions(s) / UC Diagnoses   Final diagnoses:  Paronychia of great toe of right foot     Discharge Instructions     Keep area(s) clean and dry. Apply hot compress / towel for 5-10 minutes 3-5 times daily. Take antibiotic as prescribed with food - important to complete course. Return for worsening pain, redness, swelling, discharge, fever.    ED Prescriptions    Medication Sig Dispense Auth. Provider   doxycycline (VIBRAMYCIN) 100 MG capsule Take 1 capsule (100 mg total) by mouth 2 (two) times daily for 7 days. 14 capsule Hall-Potvin, [place],  PA-C     PDMP not reviewed this encounter.  Hall-Potvin, [place], [place] 03/22/20 1207

## 2020-03-22 NOTE — Discharge Instructions (Addendum)
Keep area(s) clean and dry. Apply hot compress / towel for 5-10 minutes 3-5 times daily. Take antibiotic as prescribed with food - important to complete course. Return for worsening pain, redness, swelling, discharge, fever 

## 2020-03-22 NOTE — ED Triage Notes (Signed)
Patient reports her right great toe became painful and swollen Saturday. Reports she is unable to put on a closed toe shoe today which is why she came in.

## 2020-04-06 ENCOUNTER — Encounter: Payer: Self-pay | Admitting: Emergency Medicine

## 2020-04-06 ENCOUNTER — Other Ambulatory Visit: Payer: Self-pay

## 2020-04-06 ENCOUNTER — Ambulatory Visit
Admission: RE | Admit: 2020-04-06 | Discharge: 2020-04-06 | Disposition: A | Discharge status: Home / Self Care | Payer: Self-pay | Source: Home / Self Care | Attending: Family Medicine | Admitting: Family Medicine

## 2020-04-06 ENCOUNTER — Ambulatory Visit
Admission: EM | Admit: 2020-04-06 | Discharge: 2020-04-06 | Disposition: A | Discharge status: Home / Self Care | Payer: Medicaid Other | Attending: Family Medicine | Admitting: Family Medicine

## 2020-04-06 ENCOUNTER — Ambulatory Visit
Admission: RE | Admit: 2020-04-06 | Discharge: 2020-04-06 | Disposition: A | Discharge status: Home / Self Care | Payer: Self-pay | Attending: Family Medicine | Admitting: Family Medicine

## 2020-04-06 DIAGNOSIS — R05 Cough: Secondary | ICD-10-CM | POA: Insufficient documentation

## 2020-04-06 DIAGNOSIS — J069 Acute upper respiratory infection, unspecified: Secondary | ICD-10-CM | POA: Insufficient documentation

## 2020-04-06 DIAGNOSIS — Z1152 Encounter for screening for COVID-19: Secondary | ICD-10-CM

## 2020-04-06 DIAGNOSIS — R062 Wheezing: Secondary | ICD-10-CM

## 2020-04-06 DIAGNOSIS — Z8701 Personal history of pneumonia (recurrent): Secondary | ICD-10-CM | POA: Insufficient documentation

## 2020-04-06 DIAGNOSIS — R059 Cough, unspecified: Secondary | ICD-10-CM

## 2020-04-06 MED ORDER — PREDNISONE 10 MG (21) PO TBPK: ORAL_TABLET | Freq: Every day | ORAL | 0 refills | Status: AC

## 2020-04-06 MED ORDER — METHYLPREDNISOLONE SODIUM SUCC 125 MG IJ SOLR
125.0000 mg | Freq: Once | INTRAMUSCULAR | Status: AC
  Administered 2020-04-06: 125 mg via INTRAMUSCULAR

## 2020-04-06 MED ORDER — AZITHROMYCIN 250 MG PO TABS: 250.0000 mg | ORAL_TABLET | Freq: Every day | ORAL | 0 refills | Status: DC

## 2020-04-06 NOTE — Discharge Instructions (Addendum)
Your COVID test is pending.  You should self quarantine until the test result is back.    Take Tylenol as needed for fever or discomfort.  Rest and keep yourself hydrated.    Go to the emergency department if you develop acute worsening symptoms.     

## 2020-04-06 NOTE — ED Provider Notes (Signed)
[facility]   [id number] 04/06/20 Arrival Time: 1513   CC: COVID symptoms  SUBJECTIVE: History from: patient.  [name] is a 34 y.o. female who presents with abrupt onset of nasal congestion, PND, SOB, wheezing, and persistent cough for the last 3 days. States that she needs a Covid test for work as well. Pt is current daily smoker and has history of pneumonia. Denies sick exposure to COVID, flu or strep. Denies recent travel. Has negative history of Covid. Has not completed Covid vaccines. Has not taken OTC medications for this. There are no aggravating or alleviating factors. Denies previous symptoms in the past. Denies fever, sinus pain, rhinorrhea, sore throat, nausea, changes in bowel or bladder habits.    ROS: As per HPI.  All other pertinent ROS negative.     Past Medical History:  Diagnosis Date  . Chronic abdominal pain   . Cocaine abuse (HCC)   . Depression   . Drug-seeking behavior   . Endometriosis   . GERD (gastroesophageal reflux disease)   . Homelessness   . Hypertension   . Polysubstance abuse [facility])    Past Surgical History:  Procedure Laterality Date  . PARTIAL HYSTERECTOMY  2009   left her ovaries  . TONSILLECTOMY     Allergies  Allergen Reactions  . Contrast Media [Iodinated Diagnostic Agents] Anaphylaxis  . Reglan [Metoclopramide] Nausea And Vomiting  . Latex Rash   No current facility-administered medications on file prior to encounter.   Current Outpatient Medications on File Prior to Encounter  Medication Sig Dispense Refill  . dicyclomine (BENTYL) 20 MG tablet Take 1 tablet (20 mg total) by mouth every 6 (six) hours as needed for spasms (for abdominal cramping). 20 tablet 0  . gabapentin (NEURONTIN) 100 MG capsule Take 100 mg by mouth at bedtime.    . ondansetron (ZOFRAN) 4 MG tablet Take 1 tablet (4 mg total) by mouth every 8 (eight) hours as needed for nausea or vomiting. 12 tablet 0  . oxyCODONE-acetaminophen (ROXICET) 5-325 MG  per tablet Take 1-2 tablets by mouth every 6 (six) hours as needed for severe pain. 15 tablet 0  . potassium chloride SA (K-DUR,KLOR-CON) 20 MEQ tablet Take 1 tablet (20 mEq total) by mouth 2 (two) times daily. 8 tablet 0  . promethazine (PHENERGAN) 12.5 MG tablet Take 12.5 mg by mouth every 4 (four) hours as needed for nausea or vomiting.    . promethazine (PHENERGAN) 25 MG suppository Place 1 suppository (25 mg total) rectally every 6 (six) hours as needed for nausea or vomiting. 12 suppository 0  . promethazine (PHENERGAN) 25 MG tablet Take 1 tablet (25 mg total) by mouth every 8 (eight) hours as needed for nausea or vomiting. 10 tablet 0   Social History   Socioeconomic History  . Marital status: Legally Separated    Spouse name: Not on file  . Number of children: Not on file  . Years of education: Not on file  . Highest education level: Not on file  Occupational History  . Not on file  Tobacco Use  . Smoking status: Current Every Day Smoker    Types: Cigarettes  . Smokeless tobacco: Never Used  Substance and Sexual Activity  . Alcohol use: No  . Drug use: Yes    Types: Marijuana    Comment: Narcotics  . Sexual activity: Not Currently    Birth control/protection: Surgical    Comment: 01/26/14  Other Topics Concern  . Not on file  Social History  Narrative  . Not on file   Social Determinants of Health   Financial Resource Strain:   . Difficulty of Paying Living Expenses: Not on file  Food Insecurity:   . Worried About Programme researcher, broadcasting/film/video in the Last Year: Not on file  . Ran Out of Food in the Last Year: Not on file  Transportation Needs:   . Lack of Transportation (Medical): Not on file  . Lack of Transportation (Non-Medical): Not on file  Physical Activity:   . Days of Exercise per Week: Not on file  . Minutes of Exercise per Session: Not on file  Stress:   . Feeling of Stress : Not on file  Social Connections:   . Frequency of Communication with Friends and  Family: Not on file  . Frequency of Social Gatherings with Friends and Family: Not on file  . Attends Religious Services: Not on file  . Active Member of Clubs or Organizations: Not on file  . Attends Banker Meetings: Not on file  . Marital Status: Not on file  Intimate Partner Violence:   . Fear of Current or Ex-Partner: Not on file  . Emotionally Abused: Not on file  . Physically Abused: Not on file  . Sexually Abused: Not on file   No family history on file.  OBJECTIVE:  Vitals:   04/06/20 1529 04/06/20 1532  BP:  136/82  Pulse:  74  Resp:  20  Temp:  98.9 F (37.2 C)  TempSrc:  Oral  SpO2:  97%  Weight: 155 lb (70.3 kg)      General appearance: alert; appears fatigued, but nontoxic; speaking in full sentences and tolerating own secretions HEENT: NCAT; Ears: EACs clear, TMs pearly gray; Eyes: PERRL.  EOM grossly intact. Sinuses: nontender; Nose: nares patent without rhinorrhea, Throat: oropharynx clear, tonsils non erythematous or enlarged, uvula midline  Neck: supple with LAD Lungs: unlabored respirations, symmetrical air entry; cough: marked; no respiratory distress; wheezing and coarse lung sounds throughout bilateral lung fields Heart: regular rate and rhythm.  Radial pulses 2+ symmetrical bilaterally Skin: warm and dry Psychological: alert and cooperative; normal mood and affect  LABS:  No results found for this or any previous visit (from the past 24 hour(s)).   ASSESSMENT & PLAN:  1. Upper respiratory tract infection, unspecified type   2. Cough   3. Wheezing   4. Encounter for screening for COVID-19     Meds ordered this encounter  Medications  . methylPREDNISolone sodium succinate (SOLU-MEDROL) 125 mg/2 mL injection 125 mg  . azithromycin (ZITHROMAX) 250 MG tablet    Sig: Take 1 tablet (250 mg total) by mouth daily. Take first 2 tablets together, then 1 every day until finished.    Dispense:  6 tablet    Refill:  0    Order Specific  Question:   Supervising Provider    Answer:   Merrilee Jansky [id number]  . predniSONE (STERAPRED UNI-PAK 21 TAB) 10 MG (21) TBPK tablet    Sig: Take by mouth daily for 6 days. Take 6 tablets on day 1, 5 tablets on day 2, 4 tablets on day 3, 3 tablets on day 4, 2 tablets on day 5, 1 tablet on day 6    Dispense:  21 tablet    Refill:  0    Order Specific Question:   Supervising Provider    Answer:   Merrilee Jansky [id number]   Will treat for URI  Solumedrol 125mg  IM in  office today Azithromycin prescribed Steroid taper prescribed  COVID testing ordered.  It will take between 1-2 days for test results.  Someone will contact you regarding abnormal results.    Patient should remain in quarantine until they have received Covid results.  If negative you may resume normal activities (go back to work/school) while practicing hand hygiene, social distance, and mask wearing.  If positive, patient should remain in quarantine for 10 days from symptom onset AND greater than 72 hours after symptoms resolution (absence of fever without the use of fever-reducing medication and improvement in respiratory symptoms), whichever is longer Get plenty of rest and push fluids Use OTC zyrtec for nasal congestion, runny nose, and/or sore throat Use OTC flonase for nasal congestion and runny nose Use medications daily for symptom relief Use OTC medications like ibuprofen or tylenol as needed fever or pain Call or go to the ED if you have any new or worsening symptoms such as fever, worsening cough, shortness of breath, chest tightness, chest pain, turning blue, changes in mental status.  Reviewed expectations re: course of current medical issues. Questions answered. Outlined signs and symptoms indicating need for more acute intervention. Patient verbalized understanding. After Visit Summary given.         Moshe Cipro, NP 04/06/20 1709

## 2020-04-06 NOTE — ED Triage Notes (Signed)
Patient in [facility] today c/o 3 days ago started having cough, runny nose congestion and has not subsided.  Denies: fever  OTC: naproxen, mucinex

## 2020-04-07 LAB — NOVEL CORONAVIRUS, NAA: SARS-CoV-2, NAA: NOT DETECTED

## 2020-04-07 LAB — SARS-COV-2, NAA 2 DAY TAT

## 2020-07-07 ENCOUNTER — Emergency Department: Payer: BC Managed Care – PPO

## 2020-07-07 ENCOUNTER — Other Ambulatory Visit: Payer: Self-pay

## 2020-07-07 ENCOUNTER — Emergency Department
Admission: EM | Admit: 2020-07-07 | Discharge: 2020-07-07 | Disposition: A | Discharge status: Home / Self Care | Payer: BC Managed Care – PPO | Attending: Emergency Medicine | Admitting: Emergency Medicine

## 2020-07-07 DIAGNOSIS — R0602 Shortness of breath: Secondary | ICD-10-CM | POA: Diagnosis present

## 2020-07-07 DIAGNOSIS — J45909 Unspecified asthma, uncomplicated: Secondary | ICD-10-CM | POA: Diagnosis not present

## 2020-07-07 DIAGNOSIS — Z5321 Procedure and treatment not carried out due to patient leaving prior to being seen by health care provider: Secondary | ICD-10-CM | POA: Insufficient documentation

## 2020-07-07 MED ORDER — IPRATROPIUM-ALBUTEROL 0.5-2.5 (3) MG/3ML IN SOLN: 3.0000 mL | Freq: Once | RESPIRATORY_TRACT | Status: AC

## 2020-07-07 MED ORDER — IPRATROPIUM-ALBUTEROL 0.5-2.5 (3) MG/3ML IN SOLN
RESPIRATORY_TRACT | Status: AC
  Administered 2020-07-07: 3 mL via RESPIRATORY_TRACT
  Filled 2020-07-07: qty 3

## 2020-07-07 NOTE — ED Triage Notes (Signed)
Pt reported all she needed was a breathing treatment and not an x-ray and is leaving

## 2020-07-07 NOTE — ED Notes (Signed)
Pt refuses blood work from adult asthma protocol

## 2020-07-07 NOTE — ED Triage Notes (Addendum)
PT to ED via POV c/o SHOB. HX of asthma, pt states she needs a breathing tx. Does not have access to her inhaler at this time.  No CP. Pt sounds hoarse.

## 2020-11-30 ENCOUNTER — Encounter (HOSPITAL_COMMUNITY): Payer: Self-pay | Admitting: Emergency Medicine

## 2020-11-30 ENCOUNTER — Ambulatory Visit (HOSPITAL_COMMUNITY)
Admission: EM | Admit: 2020-11-30 | Discharge: 2020-11-30 | Disposition: A | Discharge status: Home / Self Care | Payer: BC Managed Care – PPO | Attending: Internal Medicine | Admitting: Internal Medicine

## 2020-11-30 ENCOUNTER — Other Ambulatory Visit: Payer: Self-pay

## 2020-11-30 DIAGNOSIS — J45909 Unspecified asthma, uncomplicated: Secondary | ICD-10-CM | POA: Insufficient documentation

## 2020-11-30 DIAGNOSIS — J209 Acute bronchitis, unspecified: Secondary | ICD-10-CM

## 2020-11-30 DIAGNOSIS — Z716 Tobacco abuse counseling: Secondary | ICD-10-CM | POA: Insufficient documentation

## 2020-11-30 DIAGNOSIS — Z1152 Encounter for screening for COVID-19: Secondary | ICD-10-CM

## 2020-11-30 MED ORDER — ALBUTEROL SULFATE HFA 108 (90 BASE) MCG/ACT IN AERS: 1.0000 | INHALATION_SPRAY | RESPIRATORY_TRACT | 0 refills | Status: DC | PRN

## 2020-11-30 MED ORDER — PREDNISONE 20 MG PO TABS: 40.0000 mg | ORAL_TABLET | Freq: Every day | ORAL | 0 refills | Status: AC

## 2020-11-30 NOTE — ED Provider Notes (Signed)
[facility]    CSN: [id number] Arrival date & time: 11/30/20  1921      History   Chief Complaint Chief Complaint  Patient presents with  . Shortness of Breath  . Back Pain    HPI [name] is a 35 y.o. female presents to urgent care with complaints of shortness of breath x1 week.  Patient attributes symptoms to asthma flare.  She has been out of rescue inhaler but has used her home nebulizer twice daily for the last week with some relief.  She gave herself a nebulizer treatment just prior to arrival.  Describes cough productive of thick phlegm.  Patient denies any fever or chills, headache, dizziness, abdominal pain, N/V/D.   Past Medical History:  Diagnosis Date  . Chronic abdominal pain   . Cocaine abuse (HCC)   . Depression   . Drug-seeking behavior   . Endometriosis   . GERD (gastroesophageal reflux disease)   . Homelessness   . Hypertension   . Polysubstance abuse [facility])     Patient Active Problem List   Diagnosis Date Noted  . Drug-seeking behavior 01/21/2014  . DEPRESSION 05/05/2009  . HYPERTENSION 05/05/2009  . GERD 05/05/2009  . ABDOMINAL PAIN, CHRONIC 05/05/2009    Past Surgical History:  Procedure Laterality Date  . PARTIAL HYSTERECTOMY  2009   left her ovaries  . TONSILLECTOMY      OB History    Gravida  2   Para  2   Term  2   Preterm  0   AB  0   Living  2     SAB  0   IAB  0   Ectopic  0   Multiple  0   Live Births  2            Home Medications    Prior to Admission medications   Medication Sig Start Date End Date Taking? Authorizing Provider  albuterol (VENTOLIN HFA) 108 (90 Base) MCG/ACT inhaler Inhale 1-2 puffs into the lungs every 4 (four) hours as needed for wheezing or shortness of breath. 11/30/20  Yes Rolla Etienne, NP  predniSONE (DELTASONE) 20 MG tablet Take 2 tablets (40 mg total) by mouth daily with breakfast for 7 days. 11/30/20 12/07/20 Yes Rolla Etienne, NP  azithromycin (ZITHROMAX) 250  MG tablet Take 1 tablet (250 mg total) by mouth daily. Take first 2 tablets together, then 1 every day until finished. 04/06/20   Moshe Cipro, NP  dicyclomine (BENTYL) 20 MG tablet Take 1 tablet (20 mg total) by mouth every 6 (six) hours as needed for spasms (for abdominal cramping). 01/25/14   Marisa Severin, MD  gabapentin (NEURONTIN) 100 MG capsule Take 100 mg by mouth at bedtime.    [provider]  ondansetron (ZOFRAN) 4 MG tablet Take 1 tablet (4 mg total) by mouth every 8 (eight) hours as needed for nausea or vomiting. 01/27/14   Devoria Albe, MD  oxyCODONE-acetaminophen (ROXICET) 5-325 MG per tablet Take 1-2 tablets by mouth every 6 (six) hours as needed for severe pain. 01/21/14   Marny Lowenstein, PA-C  potassium chloride SA (K-DUR,KLOR-CON) 20 MEQ tablet Take 1 tablet (20 mEq total) by mouth 2 (two) times daily. 01/27/14   Devoria Albe, MD  promethazine (PHENERGAN) 12.5 MG tablet Take 12.5 mg by mouth every 4 (four) hours as needed for nausea or vomiting.    [provider]  promethazine (PHENERGAN) 25 MG suppository Place 1 suppository (25 mg total)  rectally every 6 (six) hours as needed for nausea or vomiting. 01/25/14   Marisa Severin, MD  promethazine (PHENERGAN) 25 MG tablet Take 1 tablet (25 mg total) by mouth every 8 (eight) hours as needed for nausea or vomiting. 01/27/14   Devoria Albe, MD    Family History History reviewed. No pertinent family history.  Social History Social History   Tobacco Use  . Smoking status: Current Every Day Smoker    Packs/day: 0.50    Types: Cigarettes  . Smokeless tobacco: Never Used  Substance Use Topics  . Alcohol use: No  . Drug use: Yes    Types: Marijuana    Comment: Narcotics     Allergies   Contrast media [iodinated diagnostic agents], Reglan [metoclopramide], and Latex   Review of Systems As stated in HPI otherwise negative   Physical Exam Triage Vital Signs ED Triage Vitals  Enc Vitals Group     BP 11/30/20 1938  135/61     Pulse Rate 11/30/20 1938 (!) 120     Resp 11/30/20 1938 18     Temp 11/30/20 1938 98.3 F (36.8 C)     Temp Source 11/30/20 1938 Oral     SpO2 11/30/20 1938 97 %     Weight --      Height --      Head Circumference --      Peak Flow --      Pain Score 11/30/20 1937 3     Pain Loc --      Pain Edu? --      Excl. in GC? --    No data found.  Updated Vital Signs BP 135/61 (BP Location: Left Arm)   Pulse (!) 113 Comment: re-taken by Vernona Rieger NP  Temp 98.3 F (36.8 C) (Oral)   Resp 18   SpO2 97%   Visual Acuity Right Eye Distance:   Left Eye Distance:   Bilateral Distance:    Right Eye Near:   Left Eye Near:    Bilateral Near:     Physical Exam Constitutional:      General: She is not in acute distress.    Appearance: She is well-developed. She is not ill-appearing or toxic-appearing.  HENT:     Mouth/Throat:     Mouth: Mucous membranes are moist.     Pharynx: Oropharynx is clear. No pharyngeal swelling or oropharyngeal exudate.  Eyes:     Extraocular Movements: Extraocular movements intact.  Cardiovascular:     Rate and Rhythm: Normal rate and regular rhythm.     Heart sounds: No murmur heard. No friction rub. No gallop.   Pulmonary:     Effort: Pulmonary effort is normal. No tachypnea.     Comments: Diffuse expiratory wheeze, mild, L>R.  No increased work of breathing.  No rales or crackles Chest:     Chest wall: No deformity or crepitus.  Abdominal:     General: Bowel sounds are normal.     Palpations: Abdomen is soft.  Musculoskeletal:        General: Normal range of motion.     Cervical back: Normal range of motion.  Lymphadenopathy:     Cervical: No cervical adenopathy.  Skin:    General: Skin is warm and dry.  Neurological:     General: No focal deficit present.     Mental Status: She is alert.  Psychiatric:        Mood and Affect: Mood normal.  Behavior: Behavior normal.      UC Treatments / Results  Labs (all labs ordered  are listed, but only abnormal results are displayed) Labs Reviewed  SARS CORONAVIRUS 2 (TAT 6-24 HRS)    EKG   Radiology No results found.  Procedures Procedures (including critical care time)  Medications Ordered in UC Medications - No data to display  Initial Impression / Assessment and Plan / UC Course  I have reviewed the triage vital signs and the nursing notes.  Pertinent labs & imaging results that were available during my care of the patient were reviewed by me and considered in my medical decision making (see chart for details).  Acute bronchitis, mild with hx of asthma -No evidence of bacterial infection as patient afebrile, VSS, nontoxic appearing.  Suspect precipitated by "dusty warehouse" at work.  Will rule out COVID with PCR testing though patient is outside of isolation window -Steroid burst, albuterol inhaler as needed -Patient counseled on smoking cessation -Reviewed strict follow-up precautions  Reviewed expections re: course of current medical issues. Questions answered. Outlined signs and symptoms indicating need for more acute intervention. Pt verbalized understanding. AVS given  Final Clinical Impressions(s) / UC Diagnoses   Final diagnoses:  Acute bronchitis, unspecified organism  Mild asthma without complication, unspecified whether persistent  Encounter for screening for COVID-19  Tobacco abuse counseling     Discharge Instructions     I am treating you for acute bronchitis with a short steroid burst as we discussed.  Use your albuterol inhaler 1 to 2 puffs every 4 hours as needed for shortness of breath.  Your COVID testing results should be available in the next 1 to 2 days.  Again, your symptoms have been ongoing for over 5 days therefore no need to isolate.  Please return or go to the emergency department for any persistent or worsening symptoms.    ED Prescriptions    Medication Sig Dispense Auth. Provider   predniSONE (DELTASONE) 20  MG tablet Take 2 tablets (40 mg total) by mouth daily with breakfast for 7 days. 14 tablet Rolla Etienne, NP   albuterol (VENTOLIN HFA) 108 (90 Base) MCG/ACT inhaler Inhale 1-2 puffs into the lungs every 4 (four) hours as needed for wheezing or shortness of breath. 8 g Rolla Etienne, NP     PDMP not reviewed this encounter.   Rolla Etienne, NP 11/30/20 2040

## 2020-11-30 NOTE — Discharge Instructions (Addendum)
I am treating you for acute bronchitis with a short steroid burst as we discussed.  Use your albuterol inhaler 1 to 2 puffs every 4 hours as needed for shortness of breath.  Your COVID testing results should be available in the next 1 to 2 days.  Again, your symptoms have been ongoing for over 5 days therefore no need to isolate.  Please return or go to the emergency department for any persistent or worsening symptoms.

## 2020-11-30 NOTE — ED Triage Notes (Signed)
Pt presents with sob and back pain xs 1 week. Using nebulizer 2 times a day.

## 2020-12-01 LAB — SARS CORONAVIRUS 2 (TAT 6-24 HRS): SARS Coronavirus 2: NEGATIVE

## 2020-12-07 ENCOUNTER — Inpatient Hospital Stay (HOSPITAL_COMMUNITY)
Admission: EM | Admit: 2020-12-07 | Discharge: 2020-12-10 | DRG: 377 | Disposition: A | Discharge status: Home / Self Care | Payer: Self-pay | Attending: Internal Medicine | Admitting: Internal Medicine

## 2020-12-07 ENCOUNTER — Encounter (HOSPITAL_COMMUNITY): Payer: Self-pay | Admitting: Emergency Medicine

## 2020-12-07 ENCOUNTER — Other Ambulatory Visit: Payer: Self-pay

## 2020-12-07 DIAGNOSIS — R197 Diarrhea, unspecified: Secondary | ICD-10-CM | POA: Diagnosis present

## 2020-12-07 DIAGNOSIS — Z20822 Contact with and (suspected) exposure to covid-19: Secondary | ICD-10-CM | POA: Diagnosis present

## 2020-12-07 DIAGNOSIS — R1013 Epigastric pain: Secondary | ICD-10-CM

## 2020-12-07 DIAGNOSIS — Z888 Allergy status to other drugs, medicaments and biological substances status: Secondary | ICD-10-CM

## 2020-12-07 DIAGNOSIS — Z5900 Homelessness unspecified: Secondary | ICD-10-CM

## 2020-12-07 DIAGNOSIS — F17210 Nicotine dependence, cigarettes, uncomplicated: Secondary | ICD-10-CM | POA: Diagnosis present

## 2020-12-07 DIAGNOSIS — F1121 Opioid dependence, in remission: Secondary | ICD-10-CM | POA: Diagnosis present

## 2020-12-07 DIAGNOSIS — Z91040 Latex allergy status: Secondary | ICD-10-CM

## 2020-12-07 DIAGNOSIS — Z91041 Radiographic dye allergy status: Secondary | ICD-10-CM

## 2020-12-07 DIAGNOSIS — Z90711 Acquired absence of uterus with remaining cervical stump: Secondary | ICD-10-CM

## 2020-12-07 DIAGNOSIS — R112 Nausea with vomiting, unspecified: Secondary | ICD-10-CM | POA: Diagnosis present

## 2020-12-07 DIAGNOSIS — D72828 Other elevated white blood cell count: Secondary | ICD-10-CM | POA: Diagnosis present

## 2020-12-07 DIAGNOSIS — K5900 Constipation, unspecified: Secondary | ICD-10-CM | POA: Diagnosis present

## 2020-12-07 DIAGNOSIS — K922 Gastrointestinal hemorrhage, unspecified: Secondary | ICD-10-CM | POA: Diagnosis present

## 2020-12-07 DIAGNOSIS — T39395A Adverse effect of other nonsteroidal anti-inflammatory drugs [NSAID], initial encounter: Secondary | ICD-10-CM | POA: Diagnosis present

## 2020-12-07 DIAGNOSIS — I10 Essential (primary) hypertension: Secondary | ICD-10-CM | POA: Diagnosis present

## 2020-12-07 DIAGNOSIS — J45909 Unspecified asthma, uncomplicated: Secondary | ICD-10-CM

## 2020-12-07 DIAGNOSIS — R1030 Lower abdominal pain, unspecified: Secondary | ICD-10-CM

## 2020-12-07 DIAGNOSIS — Z79899 Other long term (current) drug therapy: Secondary | ICD-10-CM

## 2020-12-07 DIAGNOSIS — K264 Chronic or unspecified duodenal ulcer with hemorrhage: Principal | ICD-10-CM | POA: Diagnosis present

## 2020-12-07 DIAGNOSIS — K219 Gastro-esophageal reflux disease without esophagitis: Secondary | ICD-10-CM | POA: Diagnosis present

## 2020-12-07 DIAGNOSIS — K2971 Gastritis, unspecified, with bleeding: Secondary | ICD-10-CM | POA: Diagnosis present

## 2020-12-07 DIAGNOSIS — F1510 Other stimulant abuse, uncomplicated: Secondary | ICD-10-CM | POA: Diagnosis present

## 2020-12-07 DIAGNOSIS — K2101 Gastro-esophageal reflux disease with esophagitis, with bleeding: Secondary | ICD-10-CM | POA: Diagnosis present

## 2020-12-07 DIAGNOSIS — E876 Hypokalemia: Secondary | ICD-10-CM | POA: Diagnosis not present

## 2020-12-07 DIAGNOSIS — F1210 Cannabis abuse, uncomplicated: Secondary | ICD-10-CM | POA: Diagnosis present

## 2020-12-07 LAB — CBC
HCT: 49.3 % — ABNORMAL HIGH (ref 36.0–46.0)
Hemoglobin: 16.1 g/dL — ABNORMAL HIGH (ref 12.0–15.0)
MCH: 30.6 pg (ref 26.0–34.0)
MCHC: 32.7 g/dL (ref 30.0–36.0)
MCV: 93.7 fL (ref 80.0–100.0)
Platelets: 217 10*3/uL (ref 150–400)
RBC: 5.26 MIL/uL — ABNORMAL HIGH (ref 3.87–5.11)
RDW: 12.1 % (ref 11.5–15.5)
WBC: 23 10*3/uL — ABNORMAL HIGH (ref 4.0–10.5)
nRBC: 0 % (ref 0.0–0.2)

## 2020-12-07 LAB — COMPREHENSIVE METABOLIC PANEL
ALT: 28 U/L (ref 0–44)
AST: 29 U/L (ref 15–41)
Albumin: 4.7 g/dL (ref 3.5–5.0)
Alkaline Phosphatase: 76 U/L (ref 38–126)
Anion gap: 13 (ref 5–15)
BUN: 23 mg/dL — ABNORMAL HIGH (ref 6–20)
CO2: 23 mmol/L (ref 22–32)
Calcium: 9.9 mg/dL (ref 8.9–10.3)
Chloride: 107 mmol/L (ref 98–111)
Creatinine, Ser: 0.78 mg/dL (ref 0.44–1.00)
GFR, Estimated: >60 mL/min (ref >60)
Glucose, Bld: 135 mg/dL — ABNORMAL HIGH (ref 70–99)
Potassium: 3.9 mmol/L (ref 3.5–5.1)
Sodium: 143 mmol/L (ref 135–145)
Total Bilirubin: 0.1 mg/dL — ABNORMAL LOW (ref 0.3–1.2)
Total Protein: 8.4 g/dL — ABNORMAL HIGH (ref 6.5–8.1)

## 2020-12-07 LAB — LIPASE, BLOOD: Lipase: 21 U/L (ref 11–51)

## 2020-12-07 NOTE — ED Notes (Signed)
This writer was unable to obtain a temperature during vital signs due to patient vomiting

## 2020-12-07 NOTE — ED Triage Notes (Signed)
Pt states she has nausea and vomiting starting this morning. Pt states she has 10/10 abdominal pain. Pt reports that she has no known allergies. Pt states she recently started taking prednisone, and she thinks it may have made her feel sick.

## 2020-12-07 NOTE — ED Triage Notes (Signed)
EMS gave 4mg  zofran IM prior to arrival

## 2020-12-08 ENCOUNTER — Emergency Department (HOSPITAL_COMMUNITY): Payer: Self-pay

## 2020-12-08 ENCOUNTER — Encounter (HOSPITAL_COMMUNITY): Payer: Self-pay | Admitting: Internal Medicine

## 2020-12-08 DIAGNOSIS — R112 Nausea with vomiting, unspecified: Secondary | ICD-10-CM | POA: Diagnosis present

## 2020-12-08 DIAGNOSIS — J45909 Unspecified asthma, uncomplicated: Secondary | ICD-10-CM

## 2020-12-08 LAB — RAPID URINE DRUG SCREEN, HOSP PERFORMED
Amphetamines: POSITIVE — AB
Barbiturates: NOT DETECTED
Benzodiazepines: NOT DETECTED
Cocaine: NOT DETECTED
Opiates: NOT DETECTED
Tetrahydrocannabinol: POSITIVE — AB

## 2020-12-08 LAB — URINALYSIS, ROUTINE W REFLEX MICROSCOPIC
Bilirubin Urine: NEGATIVE
Glucose, UA: NEGATIVE mg/dL
Hgb urine dipstick: NEGATIVE
Ketones, ur: 5 mg/dL — AB
Leukocytes,Ua: NEGATIVE
Nitrite: NEGATIVE
Protein, ur: 30 mg/dL — AB
Specific Gravity, Urine: 1.026 (ref 1.005–1.030)
pH: 6 (ref 5.0–8.0)

## 2020-12-08 LAB — WET PREP, GENITAL
Clue Cells Wet Prep HPF POC: NONE SEEN
Sperm: NONE SEEN
Trich, Wet Prep: NONE SEEN
Yeast Wet Prep HPF POC: NONE SEEN

## 2020-12-08 LAB — HIV ANTIBODY (ROUTINE TESTING W REFLEX): HIV Screen 4th Generation wRfx: NONREACTIVE

## 2020-12-08 LAB — I-STAT BETA HCG BLOOD, ED (MC, WL, AP ONLY): I-stat hCG, quantitative: <5 m[IU]/mL (ref <5)

## 2020-12-08 LAB — RESP PANEL BY RT-PCR (FLU A&B, COVID) ARPGX2
Influenza A by PCR: NEGATIVE
Influenza B by PCR: NEGATIVE
SARS Coronavirus 2 by RT PCR: NEGATIVE

## 2020-12-08 MED ORDER — ONDANSETRON HCL 4 MG PO TABS: 4.0000 mg | ORAL_TABLET | Freq: Four times a day (QID) | ORAL | Status: DC | PRN

## 2020-12-08 MED ORDER — ONDANSETRON HCL 4 MG/2ML IJ SOLN
4.0000 mg | Freq: Once | INTRAMUSCULAR | Status: AC
  Administered 2020-12-08: 4 mg via INTRAVENOUS
  Filled 2020-12-08: qty 2

## 2020-12-08 MED ORDER — ALBUTEROL SULFATE (2.5 MG/3ML) 0.083% IN NEBU: 2.5000 mg | INHALATION_SOLUTION | RESPIRATORY_TRACT | Status: DC | PRN

## 2020-12-08 MED ORDER — FENTANYL CITRATE (PF) 100 MCG/2ML IJ SOLN
50.0000 ug | Freq: Once | INTRAMUSCULAR | Status: AC
  Administered 2020-12-08: 50 ug via INTRAVENOUS
  Filled 2020-12-08: qty 2

## 2020-12-08 MED ORDER — ACETAMINOPHEN 650 MG RE SUPP: 650.0000 mg | Freq: Four times a day (QID) | RECTAL | Status: DC | PRN

## 2020-12-08 MED ORDER — ONDANSETRON HCL 4 MG PO TABS: 4.0000 mg | ORAL_TABLET | Freq: Four times a day (QID) | ORAL | 0 refills | Status: AC

## 2020-12-08 MED ORDER — ONDANSETRON HCL 4 MG/2ML IJ SOLN
4.0000 mg | Freq: Four times a day (QID) | INTRAMUSCULAR | Status: DC | PRN
  Administered 2020-12-08 – 2020-12-09 (×3): 4 mg via INTRAVENOUS
  Filled 2020-12-08 (×3): qty 2

## 2020-12-08 MED ORDER — SODIUM CHLORIDE 0.9 % IV BOLUS (SEPSIS)
500.0000 mL | Freq: Once | INTRAVENOUS | Status: AC
  Administered 2020-12-08: 500 mL via INTRAVENOUS

## 2020-12-08 MED ORDER — ACETAMINOPHEN 325 MG PO TABS
650.0000 mg | ORAL_TABLET | Freq: Four times a day (QID) | ORAL | Status: DC | PRN
  Administered 2020-12-08: 650 mg via ORAL
  Filled 2020-12-08 (×2): qty 2

## 2020-12-08 MED ORDER — PANTOPRAZOLE SODIUM 40 MG IV SOLR
40.0000 mg | Freq: Two times a day (BID) | INTRAVENOUS | Status: DC
  Administered 2020-12-08 – 2020-12-10 (×5): 40 mg via INTRAVENOUS
  Filled 2020-12-08 (×5): qty 40

## 2020-12-08 MED ORDER — GABAPENTIN 100 MG PO CAPS
100.0000 mg | ORAL_CAPSULE | Freq: Every day | ORAL | Status: DC
  Administered 2020-12-08 – 2020-12-09 (×2): 100 mg via ORAL
  Filled 2020-12-08 (×3): qty 1

## 2020-12-08 MED ORDER — LACTATED RINGERS IV SOLN: INTRAVENOUS | Status: AC

## 2020-12-08 MED ORDER — SODIUM CHLORIDE 0.9 % IV SOLN
12.5000 mg | Freq: Four times a day (QID) | INTRAVENOUS | Status: DC | PRN
  Administered 2020-12-08: 12.5 mg via INTRAVENOUS
  Filled 2020-12-08: qty 12.5

## 2020-12-08 NOTE — ED Notes (Signed)
Pt aware urine sample is needed 

## 2020-12-08 NOTE — H&P (Signed)
History and Physical        Hospital Admission Note Date: 12/08/2020  Patient name: [name] Medical record number: [id number] Date of birth: 10-25-1985 Age: 35 y.o. Gender: female  PCP: Patient, No Pcp Per (Inactive)    Chief Complaint    Chief Complaint  Patient presents with  . Abdominal Pain  . Nausea      HPI:   This is a 35 year old female with past medical history of chronic abdominal pain, polysubstance abuse, depression, endometriosis s/p partial hysterectomy, homelessness who presented to the ED with 1 day of constant epigastric abdominal pain described as sharp and stabbing without radiation.  Worse with movement and not alleviated with NSAIDs.  Associated with nausea and vomiting and 1 episode of scant hematemesis today.  Denies diarrhea or notable blood in her stool.  Of note, she was recently seen at an urgent care on 5/24 for shortness of breath x1 week attributed to an asthma flare but at the time denied abdominal pain, N/V/D and was prescribed a short course of steroids for an acute bronchitis.  She also takes 2 tablets of Aleve every 3 days regularly for chronic pain.  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  Additionally she has a remote history of cocaine abuse which she stopped 13 years ago since the birth of her son. She also admits to using marijuana regularly and most recently was on Sunday. She says she frequently quits and does not have issues with this.    ED Course: Afebrile, hemodynamically stable, on room air. Notable Labs: WBC 23.0, Hb 16.1, COVID-19 and flu pending otherwise labs unremarkable. Notable Imaging: Abdominal US unremarkable.  CT abdomen pelvis without contrast unremarkable for acute findings. Patient received fentanyl, Zofran, Phenergan, 500 cc NS bolus.  She failed a p.o. trial  in the ED prompting hospitalist admission.    Vitals:   12/08/20 1130 12/08/20 1200  BP: (!) 155/88 110/68  Pulse: 70 62  Resp: 19 (!) 22  Temp:    SpO2: 98% 95%     Review of Systems:  Review of Systems  All other systems reviewed and are negative.   Medical/Social/Family History   Past Medical History: Past Medical History:  Diagnosis Date  . Chronic abdominal pain   . Cocaine abuse (HCC)   . Depression   . Drug-seeking behavior   . Endometriosis   . GERD (gastroesophageal reflux disease)   . Homelessness   . Hypertension   . Polysubstance abuse [facility])     Past Surgical History:  Procedure Laterality Date  . PARTIAL HYSTERECTOMY  2009   left her ovaries  . TONSILLECTOMY      Medications: Prior to Admission medications   Medication Sig Start Date End Date Taking? Authorizing Provider  ondansetron (ZOFRAN) 4 MG tablet Take 1 tablet (4 mg total) by mouth every 6 (six) hours. 12/08/20  Yes Theron Arista, PA-C  albuterol (VENTOLIN HFA) 108 (90 Base) MCG/ACT inhaler Inhale 1-2 puffs into the lungs every 4 (four) hours as needed for wheezing or shortness of breath. 11/30/20   Rolla Etienne, NP  azithromycin (ZITHROMAX) 250 MG tablet Take 1 tablet (250  mg total) by mouth daily. Take first 2 tablets together, then 1 every day until finished. 04/06/20   Moshe Cipro, NP  dicyclomine (BENTYL) 20 MG tablet Take 1 tablet (20 mg total) by mouth every 6 (six) hours as needed for spasms (for abdominal cramping). 01/25/14   Marisa Severin, MD  gabapentin (NEURONTIN) 100 MG capsule Take 100 mg by mouth at bedtime.    [provider]  oxyCODONE-acetaminophen (ROXICET) 5-325 MG per tablet Take 1-2 tablets by mouth every 6 (six) hours as needed for severe pain. 01/21/14   Marny Lowenstein, PA-C  potassium chloride SA (K-DUR,KLOR-CON) 20 MEQ tablet Take 1 tablet (20 mEq total) by mouth 2 (two) times daily. 01/27/14   Devoria Albe, MD  promethazine (PHENERGAN) 12.5 MG tablet Take 12.5  mg by mouth every 4 (four) hours as needed for nausea or vomiting.    [provider]  promethazine (PHENERGAN) 25 MG suppository Place 1 suppository (25 mg total) rectally every 6 (six) hours as needed for nausea or vomiting. 01/25/14   Marisa Severin, MD  promethazine (PHENERGAN) 25 MG tablet Take 1 tablet (25 mg total) by mouth every 8 (eight) hours as needed for nausea or vomiting. 01/27/14   Devoria Albe, MD    Allergies:   Allergies  Allergen Reactions  . Contrast Media [Iodinated Diagnostic Agents] Anaphylaxis  . Reglan [Metoclopramide] Nausea And Vomiting  . Latex Rash    Social History:  reports that she has been smoking cigarettes. She has been smoking about 0.50 packs per day. She has never used smokeless tobacco. She reports current drug use. Drug: Marijuana. She reports that she does not drink alcohol.  Family History: History reviewed. No pertinent family history.   Objective   Physical Exam: Blood pressure 110/68, pulse 62, temperature 98.7 F (37.1 C), temperature source Oral, resp. rate (!) 22, height 5\' 8"  (1.727 m), weight 59 kg, SpO2 95 %.  Physical Exam Vitals and nursing note reviewed.  Constitutional:      Appearance: Normal appearance.  HENT:     Head: Normocephalic and atraumatic.  Eyes:     Conjunctiva/sclera: Conjunctivae normal.  Cardiovascular:     Rate and Rhythm: Normal rate and regular rhythm.  Pulmonary:     Effort: Pulmonary effort is normal.     Breath sounds: Normal breath sounds.  Abdominal:     General: Abdomen is flat.     Palpations: Abdomen is soft.     Tenderness: There is abdominal tenderness in the epigastric area.  Musculoskeletal:        General: No swelling or tenderness.  Skin:    Coloration: Skin is not jaundiced or pale.  Neurological:     Mental Status: She is alert. Mental status is at baseline.  Psychiatric:        Mood and Affect: Mood normal.        Behavior: Behavior normal.     LABS on Admission: I have  personally reviewed all the labs and imaging below    Basic Metabolic Panel: Recent Labs  Lab 12/07/20 2249  NA 143  K 3.9  CL 107  CO2 23  GLUCOSE 135*  BUN 23*  CREATININE 0.78  CALCIUM 9.9   Liver Function Tests: Recent Labs  Lab 12/07/20 2249  AST 29  ALT 28  ALKPHOS 76  BILITOT 0.1*  PROT 8.4*  ALBUMIN 4.7   Recent Labs  Lab 12/07/20 2249  LIPASE 21   No results for input(s): AMMONIA in the  last 168 hours. CBC: Recent Labs  Lab 12/07/20 2249  WBC 23.0*  HGB 16.1*  HCT 49.3*  MCV 93.7  PLT 217   Cardiac Enzymes: No results for input(s): CKTOTAL, CKMB, CKMBINDEX, TROPONINI in the last 168 hours. BNP: Invalid input(s): POCBNP CBG: No results for input(s): GLUCAP in the last 168 hours.  Radiological Exams on Admission:  CT ABDOMEN PELVIS WO CONTRAST  Result Date: 12/08/2020 CLINICAL DATA:  Nausea and vomiting with diarrhea, epigastric pain EXAM: CT ABDOMEN AND PELVIS WITHOUT CONTRAST TECHNIQUE: Multidetector CT imaging of the abdomen and pelvis was performed following the standard protocol without IV contrast. COMPARISON:  01/21/2014 FINDINGS: Lower chest: Minor lateral right base subpleural atelectasis versus scarring. Otherwise no acute lower chest finding. Normal heart size. No pericardial or pleural effusion. Hepatobiliary: Limited without IV contrast. No large focal hepatic abnormality or intrahepatic biliary dilatation. Gallbladder nondistended. Common bile duct nondilated. Pancreas: Unremarkable. No pancreatic ductal dilatation or surrounding inflammatory changes. Spleen: Normal in size without focal abnormality. Adrenals/Urinary Tract: Adrenal glands are unremarkable. Kidneys are normal, without renal calculi, focal lesion, or hydronephrosis. Bladder is unremarkable. Stomach/Bowel: Limited without oral contrast. No significant dilatation, ileus, free air. Portions of the appendix in the right hemipelvis appear normal. No free fluid, fluid collection,  hemorrhage, hematoma, abscess or ascites. Vascular/Lymphatic: Minimal aortic atherosclerotic calcifications. No aneurysm. No retroperitoneal hemorrhage or hematoma. Limited assessment without IV contrast. No large bulky adenopathy. Reproductive: Status post hysterectomy. No adnexal masses. Other: No abdominal wall hernia or abnormality. No abdominopelvic ascites. Musculoskeletal: Chronic L5 pars defects with grade 1 anterolisthesis of L5 upon S1 with associated degenerative disc disease. No acute osseous finding. Preserved vertebral body heights. IMPRESSION: No acute intra-abdominal or pelvic finding by noncontrast CT. Chronic L5 pars defects with slight progression of degenerative change and associated grade 1 anterolisthesis of L5 upon S1. Aortic Atherosclerosis (ICD10-I70.0). Electronically Signed   By: Judie Petit.  Shick M.D.   On: 12/08/2020 10:25   US Abdomen Limited  Result Date: 12/08/2020 CLINICAL DATA:  Abdominal pain. EXAM: ULTRASOUND ABDOMEN LIMITED RIGHT UPPER QUADRANT COMPARISON:  CT 01/21/2014. FINDINGS: Gallbladder: No gallstones or wall thickening visualized. No sonographic Murphy sign noted by sonographer. Common bile duct: Diameter: 3.9 mm Liver: No focal lesion identified. Within normal limits in parenchymal echogenicity. Portal vein is patent on color Doppler imaging with normal direction of blood flow towards the liver. Other: None. IMPRESSION: Negative exam.  No gallstones or biliary distention. Electronically Signed   By: Maisie Fus  Register   On: 12/08/2020 07:45      EKG: Not done    A & P   Principal Problem:   Intractable nausea and vomiting Active Problems:   GERD   Asthma   1. Epigastric abdominal pain with intractable nausea, vomiting and an episode of hematemesis with history of chronic abdominal pain a. Concerning for PUD vs gastritis in the setting of recent steroid and chronic NSAID use vs. Less likely cannabis hyperemesis syndrome b. Hemodynamically stable on room air  with stable Hb.  Lipase normal and abdominal CT without concerning findings c. Continue IV fluids d. Holding NSAIDs and steroids as she seemed over her asthma flare e. Protonix IV twice daily empirically f. GI consult to evaluate for EGD g. Will try to hold opiates as able due to history of opiate abuse  2. Asthma, not in acute exacerbation  a. Recently diagnosed bronchitis in an urgent care and treated with steroids which appears resolved b. Hold further steroids c. Continue as needed albuterol  3. Leukocytosis a. Likely reactive to pain and vomiting as well as steroid-induced  4. History of polysubstance abuse a. UDS    DVT prophylaxis: SCDs   Code Status: Full Code  Diet: N.p.o. except for sips Family Communication: Admission, patients condition and plan of care including tests being ordered have been discussed with the patient who indicates understanding and agrees with the plan and Code Status.   Disposition Plan: The appropriate patient status for this patient is OBSERVATION. Observation status is judged to be reasonable and necessary in order to provide the required intensity of service to ensure the patient's safety. The patient's presenting symptoms, physical exam findings, and initial radiographic and laboratory data in the context of their medical condition is felt to place them at decreased risk for further clinical deterioration. Furthermore, it is anticipated that the patient will be medically stable for discharge from the hospital within 2 midnights of admission. The following factors support the patient status of observation.   " The patient's presenting symptoms include nausea, vomiting and an episode of hematemesis. " The physical exam findings include epigastric abdominal pain. " The initial radiographic and laboratory data are leukocytosis.     Consultants  . GI  Procedures  . None  Time Spent on Admission: 63 minutes    Jae DireJared E Anish Vana, DO [facility]  12/08/2020, 12:48 PM

## 2020-12-08 NOTE — H&P (View-Only) (Signed)
Referring Provider: Dr Whitney Post Primary Care Physician:  Patient, No Pcp Per (Inactive) Primary Gastroenterologist: Gentry Fitz  Reason for Consultation: Nausea/vomiting, epigastric pain, hematemesis x1  HPI: [name] is a 35 y.o. female with past medical history of chronic abdominal pain, endometriosis s/p hysterectomy, and polysubstance use presenting for consultation.  Patient presented to the ED overnight due to epigastric abdominal pain, nausea, and vomiting x 1 day.  She had one episode of hematemesis.  She has chronic abdominal pain but states she has not had nausea/vomiting since a flare of endometriosis years ago.  No sick contacts.  She smokes marijuana on a regular basis, last used last night. Denies alcohol or other drug use.  Reports alternations in bowel habits between constipation and diarrhea. Denies rectal bleeding or melena.  She takes Aleve twice per week.  She was recently on steroids for bronchitis. No blood thinner use.  Colonoscopy in 11/2008 for rectal bleeding: internal hemorrhoids, otherwise normal  Past Medical History:  Diagnosis Date  . Chronic abdominal pain   . Cocaine abuse (HCC)   . Depression   . Drug-seeking behavior   . Endometriosis   . GERD (gastroesophageal reflux disease)   . Homelessness   . Hypertension   . Polysubstance abuse [facility])     Past Surgical History:  Procedure Laterality Date  . PARTIAL HYSTERECTOMY  2009   left her ovaries  . TONSILLECTOMY      Prior to Admission medications   Medication Sig Start Date End Date Taking? Authorizing Provider  ondansetron (ZOFRAN) 4 MG tablet Take 1 tablet (4 mg total) by mouth every 6 (six) hours. 12/08/20  Yes Theron Arista, PA-C  albuterol (VENTOLIN HFA) 108 (90 Base) MCG/ACT inhaler Inhale 1-2 puffs into the lungs every 4 (four) hours as needed for wheezing or shortness of breath. 11/30/20   Rolla Etienne, NP  azithromycin (ZITHROMAX) 250 MG tablet Take 1 tablet (250 mg total) by mouth  daily. Take first 2 tablets together, then 1 every day until finished. 04/06/20   Moshe Cipro, NP  dicyclomine (BENTYL) 20 MG tablet Take 1 tablet (20 mg total) by mouth every 6 (six) hours as needed for spasms (for abdominal cramping). 01/25/14   Marisa Severin, MD  gabapentin (NEURONTIN) 100 MG capsule Take 100 mg by mouth at bedtime.    [provider]  oxyCODONE-acetaminophen (ROXICET) 5-325 MG per tablet Take 1-2 tablets by mouth every 6 (six) hours as needed for severe pain. 01/21/14   Marny Lowenstein, PA-C  potassium chloride SA (K-DUR,KLOR-CON) 20 MEQ tablet Take 1 tablet (20 mEq total) by mouth 2 (two) times daily. 01/27/14   Devoria Albe, MD  promethazine (PHENERGAN) 12.5 MG tablet Take 12.5 mg by mouth every 4 (four) hours as needed for nausea or vomiting.    [provider]  promethazine (PHENERGAN) 25 MG suppository Place 1 suppository (25 mg total) rectally every 6 (six) hours as needed for nausea or vomiting. 01/25/14   Marisa Severin, MD  promethazine (PHENERGAN) 25 MG tablet Take 1 tablet (25 mg total) by mouth every 8 (eight) hours as needed for nausea or vomiting. 01/27/14   Devoria Albe, MD    Scheduled Meds: . gabapentin  100 mg Oral QHS  . pantoprazole (PROTONIX) IV  40 mg Intravenous Q12H   Continuous Infusions: . lactated ringers 125 mL/hr at 12/08/20 1248   PRN Meds:.acetaminophen **OR** acetaminophen, albuterol, ondansetron **OR** ondansetron (ZOFRAN) IV  Allergies as of 12/07/2020 - Review Complete 12/07/2020  Allergen Reaction  Noted  . Contrast media [iodinated diagnostic agents] Anaphylaxis 01/20/2014  . Reglan [metoclopramide] Nausea And Vomiting 01/20/2014  . Latex Rash 05/05/2009    History reviewed. No pertinent family history.  Social History   Socioeconomic History  . Marital status: Legally Separated    Spouse name: Not on file  . Number of children: Not on file  . Years of education: Not on file  . Highest education level: Not on file   Occupational History  . Not on file  Tobacco Use  . Smoking status: Current Every Day Smoker    Packs/day: 0.50    Types: Cigarettes  . Smokeless tobacco: Never Used  Substance and Sexual Activity  . Alcohol use: No  . Drug use: Yes    Types: Marijuana    Comment: Narcotics  . Sexual activity: Not Currently    Birth control/protection: Surgical    Comment: 01/26/14  Other Topics Concern  . Not on file  Social History Narrative  . Not on file   Social Determinants of Health   Financial Resource Strain: Not on file  Food Insecurity: Not on file  Transportation Needs: Not on file  Physical Activity: Not on file  Stress: Not on file  Social Connections: Not on file  Intimate Partner Violence: Not on file    Review of Systems: Review of Systems  Constitutional: Positive for malaise/fatigue. Negative for chills and fever.  HENT: Negative for hearing loss and tinnitus.   Eyes: Negative for pain and redness.  Respiratory: Negative for cough and shortness of breath.   Cardiovascular: Negative for chest pain and palpitations.  Gastrointestinal: Positive for abdominal pain, constipation, diarrhea, nausea and vomiting. Negative for blood in stool, heartburn and melena.  Genitourinary: Negative for flank pain and hematuria.  Musculoskeletal: Negative for falls and joint pain.  Skin: Negative for itching and rash.  Neurological: Negative for seizures and loss of consciousness.  Endo/Heme/Allergies: Negative for polydipsia. Does not bruise/bleed easily.  Psychiatric/Behavioral: Negative for substance abuse. The patient is not nervous/anxious.      Physical Exam: Vital signs: Vitals:   12/08/20 1130 12/08/20 1200  BP: (!) 155/88 110/68  Pulse: 70 62  Resp: 19 (!) 22  Temp:    SpO2: 98% 95%      Physical Exam Vitals reviewed.  Constitutional:      General: She is not in acute distress. HENT:     Head: Normocephalic and atraumatic.     Nose: Nose normal. No congestion.      Mouth/Throat:     Mouth: Mucous membranes are moist.     Pharynx: Oropharynx is clear.  Eyes:     General: No scleral icterus.    Extraocular Movements: Extraocular movements intact.     Conjunctiva/sclera: Conjunctivae normal.  Cardiovascular:     Rate and Rhythm: Normal rate and regular rhythm.  Pulmonary:     Effort: Pulmonary effort is normal. No respiratory distress.  Abdominal:     General: Bowel sounds are normal. There is no distension.     Palpations: Abdomen is soft. There is no mass.     Tenderness: There is abdominal tenderness (mild, diffuse). There is no guarding or rebound.     Hernia: No hernia is present.  Musculoskeletal:        General: No swelling or tenderness.     Cervical back: Normal range of motion and neck supple.  Skin:    General: Skin is warm and dry.  Neurological:     General:  No focal deficit present.     Mental Status: She is oriented to person, place, and time. She is lethargic.  Psychiatric:        Mood and Affect: Mood normal.        Behavior: Behavior normal. Behavior is cooperative.    GI:  Lab Results: Recent Labs    12/07/20 2249  WBC 23.0*  HGB 16.1*  HCT 49.3*  PLT 217   BMET Recent Labs    12/07/20 2249  NA 143  K 3.9  CL 107  CO2 23  GLUCOSE 135*  BUN 23*  CREATININE 0.78  CALCIUM 9.9   LFT Recent Labs    12/07/20 2249  PROT 8.4*  ALBUMIN 4.7  AST 29  ALT 28  ALKPHOS 76  BILITOT 0.1*   PT/INR No results for input(s): LABPROT, INR in the last 72 hours.   Studies/Results: CT ABDOMEN PELVIS WO CONTRAST  Result Date: 12/08/2020 CLINICAL DATA:  Nausea and vomiting with diarrhea, epigastric pain EXAM: CT ABDOMEN AND PELVIS WITHOUT CONTRAST TECHNIQUE: Multidetector CT imaging of the abdomen and pelvis was performed following the standard protocol without IV contrast. COMPARISON:  01/21/2014 FINDINGS: Lower chest: Minor lateral right base subpleural atelectasis versus scarring. Otherwise no acute lower chest  finding. Normal heart size. No pericardial or pleural effusion. Hepatobiliary: Limited without IV contrast. No large focal hepatic abnormality or intrahepatic biliary dilatation. Gallbladder nondistended. Common bile duct nondilated. Pancreas: Unremarkable. No pancreatic ductal dilatation or surrounding inflammatory changes. Spleen: Normal in size without focal abnormality. Adrenals/Urinary Tract: Adrenal glands are unremarkable. Kidneys are normal, without renal calculi, focal lesion, or hydronephrosis. Bladder is unremarkable. Stomach/Bowel: Limited without oral contrast. No significant dilatation, ileus, free air. Portions of the appendix in the right hemipelvis appear normal. No free fluid, fluid collection, hemorrhage, hematoma, abscess or ascites. Vascular/Lymphatic: Minimal aortic atherosclerotic calcifications. No aneurysm. No retroperitoneal hemorrhage or hematoma. Limited assessment without IV contrast. No large bulky adenopathy. Reproductive: Status post hysterectomy. No adnexal masses. Other: No abdominal wall hernia or abnormality. No abdominopelvic ascites. Musculoskeletal: Chronic L5 pars defects with grade 1 anterolisthesis of L5 upon S1 with associated degenerative disc disease. No acute osseous finding. Preserved vertebral body heights. IMPRESSION: No acute intra-abdominal or pelvic finding by noncontrast CT. Chronic L5 pars defects with slight progression of degenerative change and associated grade 1 anterolisthesis of L5 upon S1. Aortic Atherosclerosis (ICD10-I70.0). Electronically Signed   By: M.  Shick M.D.   On: 12/08/2020 10:25   US Abdomen Limited  Result Date: 12/08/2020 CLINICAL DATA:  Abdominal pain. EXAM: ULTRASOUND ABDOMEN LIMITED RIGHT UPPER QUADRANT COMPARISON:  CT 01/21/2014. FINDINGS: Gallbladder: No gallstones or wall thickening visualized. No sonographic Murphy sign noted by sonographer. Common bile duct: Diameter: 3.9 mm Liver: No focal lesion identified. Within normal limits  in parenchymal echogenicity. Portal vein is patent on color Doppler imaging with normal direction of blood flow towards the liver. Other: None. IMPRESSION: Negative exam.  No gallstones or biliary distention. Electronically Signed   By: Thomas  Register   On: 12/08/2020 07:45    Impression: Nausea/vomiting, epigastric pain, hematemesis x1.  PUD/gastritis vs. Cannabinoid hyperemesis syndrome. -Hgb 16.1 yesterday, likely hemoconcentrated, today's CBC pending -Elevated BUN 23 as of yesterday, dehydration vs upper GI bleeding; today's BMP pending  Plan: Continue Protonix 40 mg IV BID.  EGD tomorrow. I thoroughly discussed the procedure with the patient to include nature, alternatives, benefits, and risks (including but not limited to bleeding, infection, perforation, anesthesia/cardiac and pulmonary complications). Patient verbalized understanding   and gave verbal consent to proceed with EGD.  Clear liquid diet, NPO at midnight.  [facility] will follow.   LOS: 0 days   Edrick Kins  PA-C 12/08/2020, 1:00 PM  Contact #  [phone]

## 2020-12-08 NOTE — ED Provider Notes (Signed)
[facility]-EMERGENCY DEPT Provider Note   CSN: [id number] Arrival date & time: 12/07/20  2115     History Chief Complaint  Patient presents with  . Abdominal Pain  . Nausea    [name] is a 35 y.o. female.  HPI   Patient presents with abdominal pain x 2 days. It is constant, started acutely yesterday morning, and feels like sharp stabbing. Does not radiate. Worse with movement. Not alleviated by Benjiman Core. Associated with nausea and vomiting. No diarrhea. No hematemesis/melena/blood in stool. Patient S/P hysterectomy. No prior abdominal surgeries per patient.   Past Medical History:  Diagnosis Date  . Chronic abdominal pain   . Cocaine abuse (HCC)   . Depression   . Drug-seeking behavior   . Endometriosis   . GERD (gastroesophageal reflux disease)   . Homelessness   . Hypertension   . Polysubstance abuse [facility])     Patient Active Problem List   Diagnosis Date Noted  . Drug-seeking behavior 01/21/2014  . DEPRESSION 05/05/2009  . HYPERTENSION 05/05/2009  . GERD 05/05/2009  . ABDOMINAL PAIN, CHRONIC 05/05/2009    Past Surgical History:  Procedure Laterality Date  . PARTIAL HYSTERECTOMY  2009   left her ovaries  . TONSILLECTOMY       OB History    Gravida  2   Para  2   Term  2   Preterm  0   AB  0   Living  2     SAB  0   IAB  0   Ectopic  0   Multiple  0   Live Births  2           History reviewed. No pertinent family history.  Social History   Tobacco Use  . Smoking status: Current Every Day Smoker    Packs/day: 0.50    Types: Cigarettes  . Smokeless tobacco: Never Used  Substance Use Topics  . Alcohol use: No  . Drug use: Yes    Types: Marijuana    Comment: Narcotics    Home Medications Prior to Admission medications   Medication Sig Start Date End Date Taking? Authorizing Provider  albuterol (VENTOLIN HFA) 108 (90 Base) MCG/ACT inhaler Inhale 1-2 puffs into the lungs every 4 (four) hours as  needed for wheezing or shortness of breath. 11/30/20   Rolla Etienne, NP  azithromycin (ZITHROMAX) 250 MG tablet Take 1 tablet (250 mg total) by mouth daily. Take first 2 tablets together, then 1 every day until finished. 04/06/20   Moshe Cipro, NP  dicyclomine (BENTYL) 20 MG tablet Take 1 tablet (20 mg total) by mouth every 6 (six) hours as needed for spasms (for abdominal cramping). 01/25/14   Marisa Severin, MD  gabapentin (NEURONTIN) 100 MG capsule Take 100 mg by mouth at bedtime.    [provider]  ondansetron (ZOFRAN) 4 MG tablet Take 1 tablet (4 mg total) by mouth every 8 (eight) hours as needed for nausea or vomiting. 01/27/14   Devoria Albe, MD  oxyCODONE-acetaminophen (ROXICET) 5-325 MG per tablet Take 1-2 tablets by mouth every 6 (six) hours as needed for severe pain. 01/21/14   Marny Lowenstein, PA-C  potassium chloride SA (K-DUR,KLOR-CON) 20 MEQ tablet Take 1 tablet (20 mEq total) by mouth 2 (two) times daily. 01/27/14   Devoria Albe, MD  promethazine (PHENERGAN) 12.5 MG tablet Take 12.5 mg by mouth every 4 (four) hours as needed for nausea or vomiting.    [provider]  promethazine (PHENERGAN) 25 MG suppository Place 1 suppository (25 mg total) rectally every 6 (six) hours as needed for nausea or vomiting. 01/25/14   Marisa Severin, MD  promethazine (PHENERGAN) 25 MG tablet Take 1 tablet (25 mg total) by mouth every 8 (eight) hours as needed for nausea or vomiting. 01/27/14   Devoria Albe, MD    Allergies    Contrast media [iodinated diagnostic agents], Reglan [metoclopramide], and Latex  Review of Systems   Review of Systems  Constitutional: Negative for chills and fever.  HENT: Negative for ear pain and sore throat.   Eyes: Negative for pain and visual disturbance.  Respiratory: Negative for cough and shortness of breath.   Cardiovascular: Negative for chest pain and palpitations.  Gastrointestinal: Positive for abdominal pain, diarrhea, nausea and vomiting. Negative  for blood in stool.  Genitourinary: Negative for dysuria and hematuria.  Musculoskeletal: Negative for arthralgias and back pain.  Skin: Negative for color change and rash.  Neurological: Negative for seizures and syncope.  All other systems reviewed and are negative.   Physical Exam Updated Vital Signs BP 125/72   Pulse (!) 55   Temp 98.4 F (36.9 C) (Oral)   Resp 20   Ht 5\' 8"  (1.727 m)   Wt 59 kg   SpO2 97%   BMI 19.78 kg/m   Physical Exam Vitals and nursing note reviewed. Exam conducted with a chaperone present.  Constitutional:      Appearance: Normal appearance.  HENT:     Head: Normocephalic and atraumatic.  Eyes:     General: No scleral icterus.       Right eye: No discharge.        Left eye: No discharge.     Extraocular Movements: Extraocular movements intact.     Pupils: Pupils are equal, round, and reactive to light.  Cardiovascular:     Rate and Rhythm: Normal rate and regular rhythm.     Pulses: Normal pulses.     Heart sounds: Normal heart sounds. No murmur heard. No friction rub. No gallop.   Pulmonary:     Effort: Pulmonary effort is normal. No respiratory distress.     Breath sounds: Normal breath sounds.  Abdominal:     General: Abdomen is flat. Bowel sounds are normal. There is no distension.     Palpations: Abdomen is soft.     Tenderness: There is abdominal tenderness in the right upper quadrant and epigastric area. There is no right CVA tenderness, left CVA tenderness, guarding or rebound.     Comments: Epigastric and RUQ tenderness. No peritoneal signs.   Genitourinary:    General: Normal vulva.     Exam position: Knee-chest position.     Pubic Area: No rash.      Labia:        Right: No rash or tenderness.        Left: No rash or tenderness.      Urethra: No urethral swelling or urethral lesion.     Comments: No cervix status post hysterectomy. Some opaque, white discharge that could be physiological.  Skin:    General: Skin is warm and  dry.     Coloration: Skin is not jaundiced.  Neurological:     Mental Status: She is alert. Mental status is at baseline.     Coordination: Coordination normal.  Psychiatric:        Mood and Affect: Mood normal.     ED Results / Procedures / Treatments   Labs (  all labs ordered are listed, but only abnormal results are displayed) Labs Reviewed  COMPREHENSIVE METABOLIC PANEL - Abnormal; Notable for the following components:      Result Value   Glucose, Bld 135 (*)    BUN 23 (*)    Total Protein 8.4 (*)    Total Bilirubin 0.1 (*)    All other components within normal limits  CBC - Abnormal; Notable for the following components:   WBC 23.0 (*)    RBC 5.26 (*)    Hemoglobin 16.1 (*)    HCT 49.3 (*)    All other components within normal limits  LIPASE, BLOOD  URINALYSIS, ROUTINE W REFLEX MICROSCOPIC  I-STAT BETA HCG BLOOD, ED ([facility], [facility], [facility] ONLY)    EKG None  Radiology No results found.  Procedures Procedures   Medications Ordered in ED Medications  sodium chloride 0.9 % bolus 500 mL (500 mLs Intravenous New Bag/Given 12/08/20 0619)  ondansetron (ZOFRAN) injection 4 mg (4 mg Intravenous Given 12/08/20 6269)    ED Course  I have reviewed the triage vital signs and the nursing notes.  Pertinent labs & imaging results that were available during my care of the patient were reviewed by me and considered in my medical decision making (see chart for details).  Clinical Course as of 12/08/20 1028  Wed Dec 08, 2020  0929 Urinalysis, Routine w reflex microscopic Urine, Clean Catch(!) No UTI [HS]    Clinical Course User Index [HS] Theron Arista, PA-C   MDM Rules/Calculators/A&P                          Patient presents with abdominal pain and nausea. Vitals stable, nontoxic appearing.  Ddx includes: pancreatitis, gallbladder disease, ectopic pregnancy, diverticulitis, appendicitis, PUD, mesenteric ischemia (doubt given age, lack of risk factors), intraabdominal  abscess.  Basic labs ordered. Fluids and zofran for symptomatic vomiting treatment.  Elevated WBC. However, she appears hemoconcentrated likely from dehydration. Given concern for epigastric pain/RUQ will evaluate for gallstones.with ultrasound.   Lipase low, doubt pancreatitis. Hcg negative. Not ectopic pregnancy.   U/S unrevealing. CT without signs of acute pathology.   Patient reports her pain is improved and she feels significantly better. Will PO challenge and plan for discharge.   11:04 AM - Patient vomiting restarted with PO challangeOrdered phenergan. She states that the pain is now worse in the lower abdomen/pelvic area. Will do a pelvic exam to assess for PID. Discussed with Dr. Fredderick Phenix and we suspect the patient will need to be admitted for observation.   No cervical motion tenderness on pelvic exam. Some mild discharge. Swabs collected.  Given severity of abdominal pain, intractable vomiting and elevated WBC will try to admit for observation.  Spoke with Dr. Dairl Ponder who agrees to admit for observation.   Discussed HPI, physical exam and plan of care for this patient with attending Rolan Bucco. The attending physician evaluated this patient as part of a shared visit and agrees with plan of care.  Final Clinical Impression(s) / ED Diagnoses Final diagnoses:  None    Rx / DC Orders ED Discharge Orders    None       Theron Arista, PA-C 12/08/20 1206    Rolan Bucco, MD 12/08/20 1316

## 2020-12-08 NOTE — ED Notes (Signed)
Pt taken to CT at this time.

## 2020-12-08 NOTE — Consult Note (Signed)
Referring Provider: Dr Whitney Post Primary Care Physician:  Patient, No Pcp Per (Inactive) Primary Gastroenterologist: Gentry Fitz  Reason for Consultation: Nausea/vomiting, epigastric pain, hematemesis x1  HPI: [name] is a 35 y.o. female with past medical history of chronic abdominal pain, endometriosis s/p hysterectomy, and polysubstance use presenting for consultation.  Patient presented to the ED overnight due to epigastric abdominal pain, nausea, and vomiting x 1 day.  She had one episode of hematemesis.  She has chronic abdominal pain but states she has not had nausea/vomiting since a flare of endometriosis years ago.  No sick contacts.  She smokes marijuana on a regular basis, last used last night. Denies alcohol or other drug use.  Reports alternations in bowel habits between constipation and diarrhea. Denies rectal bleeding or melena.  She takes Aleve twice per week.  She was recently on steroids for bronchitis. No blood thinner use.  Colonoscopy in 11/2008 for rectal bleeding: internal hemorrhoids, otherwise normal  Past Medical History:  Diagnosis Date  . Chronic abdominal pain   . Cocaine abuse (HCC)   . Depression   . Drug-seeking behavior   . Endometriosis   . GERD (gastroesophageal reflux disease)   . Homelessness   . Hypertension   . Polysubstance abuse [facility])     Past Surgical History:  Procedure Laterality Date  . PARTIAL HYSTERECTOMY  2009   left her ovaries  . TONSILLECTOMY      Prior to Admission medications   Medication Sig Start Date End Date Taking? Authorizing Provider  ondansetron (ZOFRAN) 4 MG tablet Take 1 tablet (4 mg total) by mouth every 6 (six) hours. 12/08/20  Yes Theron Arista, PA-C  albuterol (VENTOLIN HFA) 108 (90 Base) MCG/ACT inhaler Inhale 1-2 puffs into the lungs every 4 (four) hours as needed for wheezing or shortness of breath. 11/30/20   Rolla Etienne, NP  azithromycin (ZITHROMAX) 250 MG tablet Take 1 tablet (250 mg total) by mouth  daily. Take first 2 tablets together, then 1 every day until finished. 04/06/20   Moshe Cipro, NP  dicyclomine (BENTYL) 20 MG tablet Take 1 tablet (20 mg total) by mouth every 6 (six) hours as needed for spasms (for abdominal cramping). 01/25/14   Marisa Severin, MD  gabapentin (NEURONTIN) 100 MG capsule Take 100 mg by mouth at bedtime.    [provider]  oxyCODONE-acetaminophen (ROXICET) 5-325 MG per tablet Take 1-2 tablets by mouth every 6 (six) hours as needed for severe pain. 01/21/14   Marny Lowenstein, PA-C  potassium chloride SA (K-DUR,KLOR-CON) 20 MEQ tablet Take 1 tablet (20 mEq total) by mouth 2 (two) times daily. 01/27/14   Devoria Albe, MD  promethazine (PHENERGAN) 12.5 MG tablet Take 12.5 mg by mouth every 4 (four) hours as needed for nausea or vomiting.    [provider]  promethazine (PHENERGAN) 25 MG suppository Place 1 suppository (25 mg total) rectally every 6 (six) hours as needed for nausea or vomiting. 01/25/14   Marisa Severin, MD  promethazine (PHENERGAN) 25 MG tablet Take 1 tablet (25 mg total) by mouth every 8 (eight) hours as needed for nausea or vomiting. 01/27/14   Devoria Albe, MD    Scheduled Meds: . gabapentin  100 mg Oral QHS  . pantoprazole (PROTONIX) IV  40 mg Intravenous Q12H   Continuous Infusions: . lactated ringers 125 mL/hr at 12/08/20 1248   PRN Meds:.acetaminophen **OR** acetaminophen, albuterol, ondansetron **OR** ondansetron (ZOFRAN) IV  Allergies as of 12/07/2020 - Review Complete 12/07/2020  Allergen Reaction  Noted  . Contrast media [iodinated diagnostic agents] Anaphylaxis 01/20/2014  . Reglan [metoclopramide] Nausea And Vomiting 01/20/2014  . Latex Rash 05/05/2009    History reviewed. No pertinent family history.  Social History   Socioeconomic History  . Marital status: Legally Separated    Spouse name: Not on file  . Number of children: Not on file  . Years of education: Not on file  . Highest education level: Not on file   Occupational History  . Not on file  Tobacco Use  . Smoking status: Current Every Day Smoker    Packs/day: 0.50    Types: Cigarettes  . Smokeless tobacco: Never Used  Substance and Sexual Activity  . Alcohol use: No  . Drug use: Yes    Types: Marijuana    Comment: Narcotics  . Sexual activity: Not Currently    Birth control/protection: Surgical    Comment: 01/26/14  Other Topics Concern  . Not on file  Social History Narrative  . Not on file   Social Determinants of Health   Financial Resource Strain: Not on file  Food Insecurity: Not on file  Transportation Needs: Not on file  Physical Activity: Not on file  Stress: Not on file  Social Connections: Not on file  Intimate Partner Violence: Not on file    Review of Systems: Review of Systems  Constitutional: Positive for malaise/fatigue. Negative for chills and fever.  HENT: Negative for hearing loss and tinnitus.   Eyes: Negative for pain and redness.  Respiratory: Negative for cough and shortness of breath.   Cardiovascular: Negative for chest pain and palpitations.  Gastrointestinal: Positive for abdominal pain, constipation, diarrhea, nausea and vomiting. Negative for blood in stool, heartburn and melena.  Genitourinary: Negative for flank pain and hematuria.  Musculoskeletal: Negative for falls and joint pain.  Skin: Negative for itching and rash.  Neurological: Negative for seizures and loss of consciousness.  Endo/Heme/Allergies: Negative for polydipsia. Does not bruise/bleed easily.  Psychiatric/Behavioral: Negative for substance abuse. The patient is not nervous/anxious.      Physical Exam: Vital signs: Vitals:   12/08/20 1130 12/08/20 1200  BP: (!) 155/88 110/68  Pulse: 70 62  Resp: 19 (!) 22  Temp:    SpO2: 98% 95%      Physical Exam Vitals reviewed.  Constitutional:      General: She is not in acute distress. HENT:     Head: Normocephalic and atraumatic.     Nose: Nose normal. No congestion.      Mouth/Throat:     Mouth: Mucous membranes are moist.     Pharynx: Oropharynx is clear.  Eyes:     General: No scleral icterus.    Extraocular Movements: Extraocular movements intact.     Conjunctiva/sclera: Conjunctivae normal.  Cardiovascular:     Rate and Rhythm: Normal rate and regular rhythm.  Pulmonary:     Effort: Pulmonary effort is normal. No respiratory distress.  Abdominal:     General: Bowel sounds are normal. There is no distension.     Palpations: Abdomen is soft. There is no mass.     Tenderness: There is abdominal tenderness (mild, diffuse). There is no guarding or rebound.     Hernia: No hernia is present.  Musculoskeletal:        General: No swelling or tenderness.     Cervical back: Normal range of motion and neck supple.  Skin:    General: Skin is warm and dry.  Neurological:     General:  No focal deficit present.     Mental Status: She is oriented to person, place, and time. She is lethargic.  Psychiatric:        Mood and Affect: Mood normal.        Behavior: Behavior normal. Behavior is cooperative.    GI:  Lab Results: Recent Labs    12/07/20 2249  WBC 23.0*  HGB 16.1*  HCT 49.3*  PLT 217   BMET Recent Labs    12/07/20 2249  NA 143  K 3.9  CL 107  CO2 23  GLUCOSE 135*  BUN 23*  CREATININE 0.78  CALCIUM 9.9   LFT Recent Labs    12/07/20 2249  PROT 8.4*  ALBUMIN 4.7  AST 29  ALT 28  ALKPHOS 76  BILITOT 0.1*   PT/INR No results for input(s): LABPROT, INR in the last 72 hours.   Studies/Results: CT ABDOMEN PELVIS WO CONTRAST  Result Date: 12/08/2020 CLINICAL DATA:  Nausea and vomiting with diarrhea, epigastric pain EXAM: CT ABDOMEN AND PELVIS WITHOUT CONTRAST TECHNIQUE: Multidetector CT imaging of the abdomen and pelvis was performed following the standard protocol without IV contrast. COMPARISON:  01/21/2014 FINDINGS: Lower chest: Minor lateral right base subpleural atelectasis versus scarring. Otherwise no acute lower chest  finding. Normal heart size. No pericardial or pleural effusion. Hepatobiliary: Limited without IV contrast. No large focal hepatic abnormality or intrahepatic biliary dilatation. Gallbladder nondistended. Common bile duct nondilated. Pancreas: Unremarkable. No pancreatic ductal dilatation or surrounding inflammatory changes. Spleen: Normal in size without focal abnormality. Adrenals/Urinary Tract: Adrenal glands are unremarkable. Kidneys are normal, without renal calculi, focal lesion, or hydronephrosis. Bladder is unremarkable. Stomach/Bowel: Limited without oral contrast. No significant dilatation, ileus, free air. Portions of the appendix in the right hemipelvis appear normal. No free fluid, fluid collection, hemorrhage, hematoma, abscess or ascites. Vascular/Lymphatic: Minimal aortic atherosclerotic calcifications. No aneurysm. No retroperitoneal hemorrhage or hematoma. Limited assessment without IV contrast. No large bulky adenopathy. Reproductive: Status post hysterectomy. No adnexal masses. Other: No abdominal wall hernia or abnormality. No abdominopelvic ascites. Musculoskeletal: Chronic L5 pars defects with grade 1 anterolisthesis of L5 upon S1 with associated degenerative disc disease. No acute osseous finding. Preserved vertebral body heights. IMPRESSION: No acute intra-abdominal or pelvic finding by noncontrast CT. Chronic L5 pars defects with slight progression of degenerative change and associated grade 1 anterolisthesis of L5 upon S1. Aortic Atherosclerosis (ICD10-I70.0). Electronically Signed   By: Judie PetitM.  Shick M.D.   On: 12/08/2020 10:25   US Abdomen Limited  Result Date: 12/08/2020 CLINICAL DATA:  Abdominal pain. EXAM: ULTRASOUND ABDOMEN LIMITED RIGHT UPPER QUADRANT COMPARISON:  CT 01/21/2014. FINDINGS: Gallbladder: No gallstones or wall thickening visualized. No sonographic Murphy sign noted by sonographer. Common bile duct: Diameter: 3.9 mm Liver: No focal lesion identified. Within normal limits  in parenchymal echogenicity. Portal vein is patent on color Doppler imaging with normal direction of blood flow towards the liver. Other: None. IMPRESSION: Negative exam.  No gallstones or biliary distention. Electronically Signed   By: Maisie Fushomas  Register   On: 12/08/2020 07:45    Impression: Nausea/vomiting, epigastric pain, hematemesis x1.  PUD/gastritis vs. Cannabinoid hyperemesis syndrome. -Hgb 16.1 yesterday, likely hemoconcentrated, today's CBC pending -Elevated BUN 23 as of yesterday, dehydration vs upper GI bleeding; today's BMP pending  Plan: Continue Protonix 40 mg IV BID.  EGD tomorrow. I thoroughly discussed the procedure with the patient to include nature, alternatives, benefits, and risks (including but not limited to bleeding, infection, perforation, anesthesia/cardiac and pulmonary complications). Patient verbalized understanding  and gave verbal consent to proceed with EGD.  Clear liquid diet, NPO at midnight.  [facility] will follow.   LOS: 0 days   Edrick Kins  PA-C 12/08/2020, 1:00 PM  Contact #  [phone]

## 2020-12-08 NOTE — ED Notes (Signed)
Pt vomiting, RN notified.

## 2020-12-08 NOTE — Anesthesia Preprocedure Evaluation (Addendum)
Anesthesia Evaluation  Patient identified by MRN, date of birth, ID band Patient awake    Reviewed: Allergy & Precautions, NPO status , Patient's Chart, lab work & pertinent test results, reviewed documented beta blocker date and time   Airway Mallampati: II  TM Distance: >3 FB Neck ROM: Full    Dental  (+) Dental Advisory Given, Partial Upper, Edentulous Lower   Pulmonary asthma , Current Smoker and Patient abstained from smoking.,    Pulmonary exam normal breath sounds clear to auscultation       Cardiovascular hypertension, Normal cardiovascular exam Rhythm:Regular Rate:Normal     Neuro/Psych PSYCHIATRIC DISORDERS Depression Homelessnegative neurological ROS     GI/Hepatic GERD  Medicated,(+)     substance abuse  alcohol use, cocaine use and marijuana use, Polysubstance abuse N/V GERD Epigastric pain-chronic Hematemesis   Endo/Other  negative endocrine ROS  Renal/GU negative Renal ROS  negative genitourinary   Musculoskeletal negative musculoskeletal ROS (+) narcotic dependent  Abdominal   Peds  Hematology  (+) REFUSES BLOOD PRODUCTS,   Anesthesia Other Findings   Reproductive/Obstetrics                          Anesthesia Physical Anesthesia Plan  ASA: II  Anesthesia Plan: MAC   Post-op Pain Management:    Induction: Intravenous  PONV Risk Score and Plan: 1 and Propofol infusion and Treatment may vary due to age or medical condition  Airway Management Planned: Natural Airway and Nasal Cannula  Additional Equipment:   Intra-op Plan:   Post-operative Plan:   Informed Consent: I have reviewed the patients History and Physical, chart, labs and discussed the procedure including the risks, benefits and alternatives for the proposed anesthesia with the patient or authorized representative who has indicated his/her understanding and acceptance.     Dental advisory  given  Plan Discussed with: CRNA and Anesthesiologist  Anesthesia Plan Comments:        Anesthesia Quick Evaluation

## 2020-12-09 ENCOUNTER — Encounter (HOSPITAL_COMMUNITY): Payer: Self-pay | Admitting: Internal Medicine

## 2020-12-09 ENCOUNTER — Observation Stay (HOSPITAL_COMMUNITY): Payer: Self-pay | Admitting: Anesthesiology

## 2020-12-09 ENCOUNTER — Encounter (HOSPITAL_COMMUNITY)
Admission: EM | Disposition: A | Discharge status: Home / Self Care | Payer: Self-pay | Source: Home / Self Care | Attending: Internal Medicine

## 2020-12-09 DIAGNOSIS — J45909 Unspecified asthma, uncomplicated: Secondary | ICD-10-CM

## 2020-12-09 DIAGNOSIS — K922 Gastrointestinal hemorrhage, unspecified: Secondary | ICD-10-CM | POA: Diagnosis present

## 2020-12-09 HISTORY — PX: ESOPHAGOGASTRODUODENOSCOPY: SHX5428

## 2020-12-09 HISTORY — PX: BIOPSY: SHX5522

## 2020-12-09 LAB — CBC
HCT: 36.9 % (ref 36.0–46.0)
Hemoglobin: 12.5 g/dL (ref 12.0–15.0)
MCH: 30.5 pg (ref 26.0–34.0)
MCHC: 33.9 g/dL (ref 30.0–36.0)
MCV: 90 fL (ref 80.0–100.0)
Platelets: 218 10*3/uL (ref 150–400)
RBC: 4.1 MIL/uL (ref 3.87–5.11)
RDW: 11.7 % (ref 11.5–15.5)
WBC: 12.1 10*3/uL — ABNORMAL HIGH (ref 4.0–10.5)
nRBC: 0 % (ref 0.0–0.2)

## 2020-12-09 LAB — GC/CHLAMYDIA PROBE AMP (~~LOC~~) NOT AT ARMC
Chlamydia: NEGATIVE
Comment: NEGATIVE
Comment: NORMAL
Neisseria Gonorrhea: NEGATIVE

## 2020-12-09 LAB — BASIC METABOLIC PANEL
Anion gap: 6 (ref 5–15)
BUN: 17 mg/dL (ref 6–20)
CO2: 28 mmol/L (ref 22–32)
Calcium: 9.2 mg/dL (ref 8.9–10.3)
Chloride: 103 mmol/L (ref 98–111)
Creatinine, Ser: 0.56 mg/dL (ref 0.44–1.00)
GFR, Estimated: >60 mL/min (ref >60)
Glucose, Bld: 98 mg/dL (ref 70–99)
Potassium: 3.7 mmol/L (ref 3.5–5.1)
Sodium: 137 mmol/L (ref 135–145)

## 2020-12-09 SURGERY — EGD (ESOPHAGOGASTRODUODENOSCOPY)
Anesthesia: Monitor Anesthesia Care

## 2020-12-09 MED ORDER — LIDOCAINE 2% (20 MG/ML) 5 ML SYRINGE
INTRAMUSCULAR | Status: DC | PRN
  Administered 2020-12-09: 60 mg via INTRAVENOUS

## 2020-12-09 MED ORDER — PROPOFOL 10 MG/ML IV BOLUS
INTRAVENOUS | Status: DC | PRN
  Administered 2020-12-09: 20 mg via INTRAVENOUS
  Administered 2020-12-09: 70 mg via INTRAVENOUS
  Administered 2020-12-09: 20 mg via INTRAVENOUS

## 2020-12-09 MED ORDER — LACTATED RINGERS IV SOLN: Freq: Once | INTRAVENOUS | Status: AC

## 2020-12-09 MED ORDER — PROPOFOL 500 MG/50ML IV EMUL
INTRAVENOUS | Status: DC | PRN
  Administered 2020-12-09: 100 ug/kg/min via INTRAVENOUS

## 2020-12-09 MED ORDER — FENTANYL CITRATE (PF) 100 MCG/2ML IJ SOLN
INTRAMUSCULAR | Status: AC
  Filled 2020-12-09: qty 2

## 2020-12-09 MED ORDER — PROMETHAZINE HCL 25 MG/ML IJ SOLN
INTRAMUSCULAR | Status: AC
  Filled 2020-12-09: qty 1

## 2020-12-09 MED ORDER — ONDANSETRON HCL 4 MG/2ML IJ SOLN
INTRAMUSCULAR | Status: DC | PRN
  Administered 2020-12-09: 4 mg via INTRAVENOUS

## 2020-12-09 MED ORDER — LACTATED RINGERS IV SOLN: INTRAVENOUS | Status: DC | PRN

## 2020-12-09 MED ORDER — GLYCOPYRROLATE PF 0.2 MG/ML IJ SOSY
PREFILLED_SYRINGE | INTRAMUSCULAR | Status: DC | PRN
  Administered 2020-12-09: .1 mg via INTRAVENOUS

## 2020-12-09 NOTE — Brief Op Note (Signed)
12/07/2020 - 12/09/2020  8:08 AM  PATIENT:  [name]  35 y.o. female  PRE-OPERATIVE DIAGNOSIS:  nausea, vomiting, epigastric pain, hematemesis  POST-OPERATIVE DIAGNOSIS:  gastritis, duodenal ulcers  PROCEDURE:  Procedure(s): ESOPHAGOGASTRODUODENOSCOPY (EGD) (N/A) BIOPSY  SURGEON:  Surgeon(s) and Role:    * Octa Uplinger, MD - Primary  Findings ----------- -EGD showed minimal esophagitis and gastritis and few linear duodenal bulb ulcers.  Most likely NSAID induced ulcers.  Recommendations ------------------------- -Continue Protonix twice daily -Avoid NSAIDs and marijuana use -Follow biopsies -Start full liquid diet and slowly advance as tolerated -If ongoing nausea and vomiting, recommend HIDA scan with EF  -GI will follow  Kathi Der MD, FACP 12/09/2020, 8:09 AM  Contact #  [phone]

## 2020-12-09 NOTE — Anesthesia Postprocedure Evaluation (Signed)
Anesthesia Post Note  Patient: [name]  Procedure(s) Performed: ESOPHAGOGASTRODUODENOSCOPY (EGD) (N/A ) BIOPSY     Patient location during evaluation: PACU Anesthesia Type: MAC Level of consciousness: awake and alert and oriented Pain management: pain level controlled Vital Signs Assessment: post-procedure vital signs reviewed and stable Respiratory status: spontaneous breathing, nonlabored ventilation and respiratory function stable Cardiovascular status: stable and blood pressure returned to baseline Postop Assessment: no apparent nausea or vomiting Anesthetic complications: no   No complications documented.  Last Vitals:  Vitals:   12/09/20 0804 12/09/20 0810  BP: (!) 163/93 (!) 154/91  Pulse: 61 (!) 48  Resp: 17 (!) 26  Temp: 36.9 C   SpO2: 100% 100%    Last Pain:  Vitals:   12/09/20 0804  TempSrc: Oral  PainSc: 4                  [name] A.

## 2020-12-09 NOTE — Plan of Care (Signed)

## 2020-12-09 NOTE — Interval H&P Note (Signed)
History and Physical Interval Note:  12/09/2020 7:25 AM  [name]  has presented today for surgery, with the diagnosis of nausea, vomiting, epigastric pain, hematemesis.  The various methods of treatment have been discussed with the patient and family. After consideration of risks, benefits and other options for treatment, the patient has consented to  Procedure(s): ESOPHAGOGASTRODUODENOSCOPY (EGD) (N/A) as a surgical intervention.  The patient's history has been reviewed, patient examined, no change in status, stable for surgery.  I have reviewed the patient's chart and labs.  Questions were answered to the patient's satisfaction.     Charlsey Moragne

## 2020-12-09 NOTE — Op Note (Signed)
[facility] Patient Name: [name] Procedure Date: 12/09/2020 MRN: [id number] Attending MD: Kathi Der , MD Date of Birth: 1985/08/19 CSN: [id number] Age: 35 Admit Type: Outpatient Procedure:                Upper GI endoscopy Indications:              Epigastric abdominal pain, Nausea with vomiting Providers:                Kathi Der, MD, Michele Mcalpine Technician,                            Fransisca Connors Referring MD:              Medicines:                Sedation Administered by an Anesthesia Professional Complications:            No immediate complications. Estimated Blood Loss:     Estimated blood loss was minimal. Procedure:                Pre-Anesthesia Assessment:                           - Prior to the procedure, a History and Physical                            was performed, and patient medications and                            allergies were reviewed. The patient's tolerance of                            previous anesthesia was also reviewed. The risks                            and benefits of the procedure and the sedation                            options and risks were discussed with the patient.                            All questions were answered, and informed consent                            was obtained. Prior Anticoagulants: The patient has                            taken no previous anticoagulant or antiplatelet                            agents. ASA Grade Assessment: II - A patient with                            mild systemic disease. After reviewing the risks  and benefits, the patient was deemed in                            satisfactory condition to undergo the procedure.                           After obtaining informed consent, the endoscope was                            passed under direct vision. Throughout the                            procedure, the patient's blood pressure,  pulse, and                            oxygen saturations were monitored continuously. The                            GIF-H190 ([id number]) Olympus gastroscope was                            introduced through the mouth, and advanced to the                            second part of duodenum. The upper GI endoscopy was                            accomplished without difficulty. The patient                            tolerated the procedure well. Scope In: Scope Out: Findings:      LA Grade A (one or more mucosal breaks less than 5 mm, not extending       between tops of 2 mucosal folds) esophagitis with no bleeding was found       at the gastroesophageal junction.      Scattered minimal inflammation characterized by congestion (edema) and       erythema was found in the entire examined stomach. Biopsies were taken       with a cold forceps for histology.      The cardia and gastric fundus were normal on retroflexion.      Few non-bleeding linear duodenal ulcers with no stigmata of bleeding       were found in the duodenal bulb. Biopsies were taken with a cold forceps       for histology.      The first portion of the duodenum and second portion of the duodenum       were normal. Impression:               - LA Grade A esophagitis with no bleeding.                           - Gastritis. Biopsied.                           - Non-bleeding duodenal ulcers with no stigmata  of                            bleeding. Biopsied.                           - Normal first portion of the duodenum and second                            portion of the duodenum. Moderate Sedation:      Moderate (conscious) sedation was personally administered by an       anesthesia professional. The following parameters were monitored: oxygen       saturation, heart rate, blood pressure, and response to care. Recommendation:           - Return patient to hospital ward for ongoing care.                           - Full liquid  diet.                           - Continue present medications.                           - Await pathology results. Procedure Code(s):        --- Professional ---                           [phone], Esophagogastroduodenoscopy, flexible,                            transoral; with biopsy, single or multiple Diagnosis Code(s):        --- Professional ---                           K20.90, Esophagitis, unspecified without bleeding                           K29.70, Gastritis, unspecified, without bleeding                           K26.9, Duodenal ulcer, unspecified as acute or                            chronic, without hemorrhage or perforation                           R10.13, Epigastric pain                           R11.2, Nausea with vomiting, unspecified CPT copyright 2019 [employer]. All rights reserved. The codes documented in this report are preliminary and upon coder review may  be revised to meet current compliance requirements. Kathi Der, MD Kathi Der, MD 12/09/2020 8:06:53 AM Number of Addenda: 0

## 2020-12-09 NOTE — Transfer of Care (Signed)
Immediate Anesthesia Transfer of Care Note  Patient: Oluwatobi Ruppe  Procedure(s) Performed: ESOPHAGOGASTRODUODENOSCOPY (EGD) (N/A ) BIOPSY  Patient Location: PACU  Anesthesia Type:MAC  Level of Consciousness: awake, alert , oriented and patient cooperative  Airway & Oxygen Therapy: Patient Spontanous Breathing and Patient connected to nasal cannula oxygen  Post-op Assessment: Report given to RN and Post -op Vital signs reviewed and stable  Post vital signs: Reviewed and stable  Last Vitals:  Vitals Value Taken Time  BP 163/93 12/09/20 0803  Temp    Pulse 55 12/09/20 0804  Resp    SpO2 100 % 12/09/20 0804  Vitals shown include unvalidated device data.  Last Pain:  Vitals:   12/09/20 0655  TempSrc: Oral  PainSc: 5       Patients Stated Pain Goal: 2 (09/98/33 8250)  Complications: No complications documented.

## 2020-12-09 NOTE — Progress Notes (Signed)
Patient ID: [name], female   DOB: 08/15/1985, 35 y.o.   MRN: [id number]  PROGRESS NOTE    [name]  STM:[id number] DOB: 05-14-1986 DOA: 12/07/2020 PCP: Patient, No Pcp Per (Inactive)   Brief Narrative:  35 year old female with past medical history of chronic abdominal pain, polysubstance abuse, depression, endometriosis s/p partial hysterectomy presented with severe abdominal pain with nausea and vomiting with 1 episode of scant hematemesis.  On presentation, WBC was 23, hemoglobin of 16.1.  Abdominal ultrasound was unremarkable.  CT abdomen and pelvis without contrast was unremarkable for acute findings.  She was started on IV Protonix.  GI was consulted.  Assessment & Plan:   Intractable nausea, vomiting with possible upper GI bleeding presenting with hematemesis -Concerning for gastritis/peptic ulcer disease versus cannabis hyperemesis syndrome -Continue IV PPI.  GI planning for EGD today.  Asthma -Not in exacerbation.  Continue as needed albuterol.  Leukocytosis -Possibly reactive.  Improving  History of polysubstance abuse -Urine drug screen positive for tetrahydrocannabinol and amphetamine    DVT prophylaxis: SCDs Code Status: Full Family Communication: None at bedside Disposition Plan: Status is: Observation  The patient will require care spanning > 2 midnights and should be moved to inpatient because: Inpatient level of care appropriate due to severity of illness  Dispo: The patient is from: Home              Anticipated d/c is to: Home              Patient currently is not medically stable to d/c.   Difficult to place patient No   Consultants: GI  Procedures: None  Antimicrobials: None   Subjective: Patient seen and examined at bedside.  Still complains of intermittent abdominal pain.  No overnight fever.  No current nausea or vomiting reported.  Objective: Vitals:   12/09/20 0820 12/09/20 0830 12/09/20 0846 12/09/20 1013  BP: (!) 163/93 (!)  166/95 111/81 (!) 137/91  Pulse: (!) 48 (!) 46 (!) 50 (!) 57  Resp: (!) 22 14 18 19   Temp:   97.8 F (36.6 C) 97.9 F (36.6 C)  TempSrc:   Oral Oral  SpO2: 99% 100% 100% 99%  Weight:      Height:        Intake/Output Summary (Last 24 hours) at 12/09/2020 1132 Last data filed at 12/09/2020 0802 Gross per 24 hour  Intake 810 ml  Output --  Net 810 ml   Filed Weights   12/07/20 2208 12/09/20 0655  Weight: 59 kg 63.5 kg    Examination:  General exam: Appears calm and comfortable.  Currently on room air Respiratory system: Bilateral decreased breath sounds at bases Cardiovascular system: S1 & S2 heard, bradycardic Gastrointestinal system: Abdomen is nondistended, soft and mildly tender in the epigastric region.  Normal bowel sounds heard. Extremities: No cyanosis, clubbing, edema  Central nervous system: Alert and oriented. No focal neurological deficits. Moving extremities Skin: No rashes, lesions or ulcers Psychiatry:  Looks intermittently anxious   Data Reviewed: I have personally reviewed following labs and imaging studies  CBC: Recent Labs  Lab 12/07/20 2249 12/09/20 0419  WBC 23.0* 12.1*  HGB 16.1* 12.5  HCT 49.3* 36.9  MCV 93.7 90.0  PLT 217 218   Basic Metabolic Panel: Recent Labs  Lab 12/07/20 2249 12/09/20 0419  NA 143 137  K 3.9 3.7  CL 107 103  CO2 23 28  GLUCOSE 135* 98  BUN 23* 17  CREATININE 0.78 0.56  CALCIUM 9.9 9.2  GFR: Estimated Creatinine Clearance: 97.5 mL/min (by C-G formula based on SCr of 0.56 mg/dL). Liver Function Tests: Recent Labs  Lab 12/07/20 2249  AST 29  ALT 28  ALKPHOS 76  BILITOT 0.1*  PROT 8.4*  ALBUMIN 4.7   Recent Labs  Lab 12/07/20 2249  LIPASE 21   No results for input(s): AMMONIA in the last 168 hours. Coagulation Profile: No results for input(s): INR, PROTIME in the last 168 hours. Cardiac Enzymes: No results for input(s): CKTOTAL, CKMB, CKMBINDEX, TROPONINI in the last 168 hours. BNP (last 3  results) No results for input(s): PROBNP in the last 8760 hours. HbA1C: No results for input(s): HGBA1C in the last 72 hours. CBG: No results for input(s): GLUCAP in the last 168 hours. Lipid Profile: No results for input(s): CHOL, HDL, LDLCALC, TRIG, CHOLHDL, LDLDIRECT in the last 72 hours. Thyroid Function Tests: No results for input(s): TSH, T4TOTAL, FREET4, T3FREE, THYROIDAB in the last 72 hours. Anemia Panel: No results for input(s): VITAMINB12, FOLATE, FERRITIN, TIBC, IRON, RETICCTPCT in the last 72 hours. Sepsis Labs: No results for input(s): PROCALCITON, LATICACIDVEN in the last 168 hours.  Recent Results (from the past 240 hour(s))  SARS CORONAVIRUS 2 (TAT 6-24 HRS) Nasopharyngeal Nasopharyngeal Swab     Status: None   Collection Time: 11/30/20  8:00 PM   Specimen: Nasopharyngeal Swab  Result Value Ref Range Status   SARS Coronavirus 2 NEGATIVE NEGATIVE Final    Comment: (NOTE) SARS-CoV-2 target nucleic acids are NOT DETECTED.  The SARS-CoV-2 RNA is generally detectable in upper and lower respiratory specimens during the acute phase of infection. Negative results do not preclude SARS-CoV-2 infection, do not rule out co-infections with other pathogens, and should not be used as the sole basis for treatment or other patient management decisions. Negative results must be combined with clinical observations, patient history, and epidemiological information. The expected result is Negative.  Fact Sheet for Patients: HairSlick.nohttps://www.fda.gov/media/138098/download  Fact Sheet for Healthcare Providers: quierodirigir.comhttps://www.fda.gov/media/138095/download  This test is not yet approved or cleared by the [place] FDA and  has been authorized for detection and/or diagnosis of SARS-CoV-2 by FDA under an Emergency Use Authorization (EUA). This EUA will remain  in effect (meaning this test can be used) for the duration of the COVID-19 declaration under Se ction 564(b)(1) of the Act, 21  U.S.C. section 360bbb-3(b)(1), unless the authorization is terminated or revoked sooner.  Performed at [facility], [address] [place], [place] [address]   Resp Panel by RT-PCR (Flu A&B, Covid) Nasopharyngeal Swab     Status: None   Collection Time: 12/08/20 11:37 AM   Specimen: Nasopharyngeal Swab; Nasopharyngeal(NP) swabs in vial transport medium  Result Value Ref Range Status   SARS Coronavirus 2 by RT PCR NEGATIVE NEGATIVE Final    Comment: (NOTE) SARS-CoV-2 target nucleic acids are NOT DETECTED.  The SARS-CoV-2 RNA is generally detectable in upper respiratory specimens during the acute phase of infection. The lowest concentration of SARS-CoV-2 viral copies this assay can detect is 138 copies/mL. A negative result does not preclude SARS-Cov-2 infection and should not be used as the sole basis for treatment or other patient management decisions. A negative result may occur with  improper specimen collection/handling, submission of specimen other than nasopharyngeal swab, presence of viral mutation(s) within the areas targeted by this assay, and inadequate number of viral copies(<138 copies/mL). A negative result must be combined with clinical observations, patient history, and epidemiological information. The expected result is Negative.  Fact  Sheet for Patients:  BloggerCourse.com  Fact Sheet for Healthcare Providers:  SeriousBroker.it  This test is no t yet approved or cleared by the [place] FDA and  has been authorized for detection and/or diagnosis of SARS-CoV-2 by FDA under an Emergency Use Authorization (EUA). This EUA will remain  in effect (meaning this test can be used) for the duration of the COVID-19 declaration under Section 564(b)(1) of the Act, 21 U.S.C.section 360bbb-3(b)(1), unless the authorization is terminated  or revoked sooner.       Influenza A by PCR NEGATIVE NEGATIVE Final    Influenza B by PCR NEGATIVE NEGATIVE Final    Comment: (NOTE) The Xpert Xpress SARS-CoV-2/FLU/RSV plus assay is intended as an aid in the diagnosis of influenza from Nasopharyngeal swab specimens and should not be used as a sole basis for treatment. Nasal washings and aspirates are unacceptable for Xpert Xpress SARS-CoV-2/FLU/RSV testing.  Fact Sheet for Patients: BloggerCourse.com  Fact Sheet for Healthcare Providers: SeriousBroker.it  This test is not yet approved or cleared by the [place] FDA and has been authorized for detection and/or diagnosis of SARS-CoV-2 by FDA under an Emergency Use Authorization (EUA). This EUA will remain in effect (meaning this test can be used) for the duration of the COVID-19 declaration under Section 564(b)(1) of the Act, 21 U.S.C. section 360bbb-3(b)(1), unless the authorization is terminated or revoked.  Performed at [facility], [address] [place], [place] [address]   Wet prep, genital     Status: Abnormal   Collection Time: 12/08/20 11:46 AM  Result Value Ref Range Status   Yeast Wet Prep HPF POC NONE SEEN NONE SEEN Final   Trich, Wet Prep NONE SEEN NONE SEEN Final   Clue Cells Wet Prep HPF POC NONE SEEN NONE SEEN Final   WBC, Wet Prep HPF POC MANY (A) NONE SEEN Final   Sperm NONE SEEN  Final    Comment: Performed at [facility], [address], [place], [place] [address]         Radiology Studies: CT ABDOMEN PELVIS WO CONTRAST  Result Date: 12/08/2020 CLINICAL DATA:  Nausea and vomiting with diarrhea, epigastric pain EXAM: CT ABDOMEN AND PELVIS WITHOUT CONTRAST TECHNIQUE: Multidetector CT imaging of the abdomen and pelvis was performed following the standard protocol without IV contrast. COMPARISON:  01/21/2014 FINDINGS: Lower chest: Minor lateral right base subpleural atelectasis versus scarring. Otherwise no acute lower chest finding.  Normal heart size. No pericardial or pleural effusion. Hepatobiliary: Limited without IV contrast. No large focal hepatic abnormality or intrahepatic biliary dilatation. Gallbladder nondistended. Common bile duct nondilated. Pancreas: Unremarkable. No pancreatic ductal dilatation or surrounding inflammatory changes. Spleen: Normal in size without focal abnormality. Adrenals/Urinary Tract: Adrenal glands are unremarkable. Kidneys are normal, without renal calculi, focal lesion, or hydronephrosis. Bladder is unremarkable. Stomach/Bowel: Limited without oral contrast. No significant dilatation, ileus, free air. Portions of the appendix in the right hemipelvis appear normal. No free fluid, fluid collection, hemorrhage, hematoma, abscess or ascites. Vascular/Lymphatic: Minimal aortic atherosclerotic calcifications. No aneurysm. No retroperitoneal hemorrhage or hematoma. Limited assessment without IV contrast. No large bulky adenopathy. Reproductive: Status post hysterectomy. No adnexal masses. Other: No abdominal wall hernia or abnormality. No abdominopelvic ascites. Musculoskeletal: Chronic L5 pars defects with grade 1 anterolisthesis of L5 upon S1 with associated degenerative disc disease. No acute osseous finding. Preserved vertebral body heights. IMPRESSION: No acute intra-abdominal or pelvic finding by noncontrast CT. Chronic L5 pars defects with slight progression of degenerative change and  associated grade 1 anterolisthesis of L5 upon S1. Aortic Atherosclerosis (ICD10-I70.0). Electronically Signed   By: Judie Petit.  Shick M.D.   On: 12/08/2020 10:25   US Abdomen Limited  Result Date: 12/08/2020 CLINICAL DATA:  Abdominal pain. EXAM: ULTRASOUND ABDOMEN LIMITED RIGHT UPPER QUADRANT COMPARISON:  CT 01/21/2014. FINDINGS: Gallbladder: No gallstones or wall thickening visualized. No sonographic Murphy sign noted by sonographer. Common bile duct: Diameter: 3.9 mm Liver: No focal lesion identified. Within normal limits in  parenchymal echogenicity. Portal vein is patent on color Doppler imaging with normal direction of blood flow towards the liver. Other: None. IMPRESSION: Negative exam.  No gallstones or biliary distention. Electronically Signed   By: Maisie Fus  Register   On: 12/08/2020 07:45        Scheduled Meds: . gabapentin  100 mg Oral QHS  . pantoprazole (PROTONIX) IV  40 mg Intravenous Q12H   Continuous Infusions:        Glade Lloyd, MD [facility] 12/09/2020, 11:32 AM

## 2020-12-10 ENCOUNTER — Encounter (HOSPITAL_COMMUNITY): Payer: Self-pay | Admitting: Gastroenterology

## 2020-12-10 DIAGNOSIS — K922 Gastrointestinal hemorrhage, unspecified: Secondary | ICD-10-CM

## 2020-12-10 LAB — CBC
HCT: 38.6 % (ref 36.0–46.0)
Hemoglobin: 13.1 g/dL (ref 12.0–15.0)
MCH: 30.4 pg (ref 26.0–34.0)
MCHC: 33.9 g/dL (ref 30.0–36.0)
MCV: 89.6 fL (ref 80.0–100.0)
Platelets: 204 10*3/uL (ref 150–400)
RBC: 4.31 MIL/uL (ref 3.87–5.11)
RDW: 11.5 % (ref 11.5–15.5)
WBC: 10 10*3/uL (ref 4.0–10.5)
nRBC: 0 % (ref 0.0–0.2)

## 2020-12-10 LAB — BASIC METABOLIC PANEL
Anion gap: 7 (ref 5–15)
BUN: 12 mg/dL (ref 6–20)
CO2: 26 mmol/L (ref 22–32)
Calcium: 8.9 mg/dL (ref 8.9–10.3)
Chloride: 103 mmol/L (ref 98–111)
Creatinine, Ser: 0.58 mg/dL (ref 0.44–1.00)
GFR, Estimated: >60 mL/min (ref >60)
Glucose, Bld: 99 mg/dL (ref 70–99)
Potassium: 2.9 mmol/L — ABNORMAL LOW (ref 3.5–5.1)
Sodium: 136 mmol/L (ref 135–145)

## 2020-12-10 LAB — MAGNESIUM: Magnesium: 1.9 mg/dL (ref 1.7–2.4)

## 2020-12-10 LAB — SURGICAL PATHOLOGY

## 2020-12-10 MED ORDER — POTASSIUM CHLORIDE CRYS ER 20 MEQ PO TBCR
40.0000 meq | EXTENDED_RELEASE_TABLET | ORAL | Status: DC
  Administered 2020-12-10: 40 meq via ORAL
  Filled 2020-12-10: qty 2

## 2020-12-10 MED ORDER — PANTOPRAZOLE SODIUM 40 MG PO TBEC: 40.0000 mg | DELAYED_RELEASE_TABLET | Freq: Two times a day (BID) | ORAL | 1 refills | Status: AC

## 2020-12-10 NOTE — Progress Notes (Signed)
[facility] Gastroenterology Progress Note  [name] 35 y.o. 07/19/1985  CC:  Nausea/vomiting, duodenal ulcers  Subjective: Patient denies abdominal pain, nausea, or vomiting.  Had a brown stool this morning.  States she is ready for discharge.  ROS : Review of Systems  Cardiovascular: Negative for chest pain and palpitations.  Gastrointestinal: Negative for abdominal pain, blood in stool, constipation, diarrhea, heartburn, melena, nausea and vomiting.   Objective: Vital signs in last 24 hours: Vitals:   12/09/20 2219 12/10/20 0534  BP: 139/89 122/88  Pulse: 61 64  Resp: 18 18  Temp: 99.1 F (37.3 C) 98.7 F (37.1 C)  SpO2: 99% 99%    Physical Exam:  General:  Alert, oriented, cooperative, no distress  Head:  Normocephalic, without obvious abnormality, atraumatic  Eyes:  Anicteric sclera, EOMs intact  Lungs:   Respirations unlabored  Heart:  Regular rate and rhythm, S1, S2 normal  Abdomen:   Soft, non-tender, non-distended  Extremities: Extremities normal, atraumatic, no  edema    Lab Results: Recent Labs    12/09/20 0419 12/10/20 0349  NA 137 136  K 3.7 2.9*  CL 103 103  CO2 28 26  GLUCOSE 98 99  BUN 17 12  CREATININE 0.56 0.58  CALCIUM 9.2 8.9  MG  --  1.9   Recent Labs    12/07/20 2249  AST 29  ALT 28  ALKPHOS 76  BILITOT 0.1*  PROT 8.4*  ALBUMIN 4.7   Recent Labs    12/09/20 0419 12/10/20 0349  WBC 12.1* 10.0  HGB 12.5 13.1  HCT 36.9 38.6  MCV 90.0 89.6  PLT 218 204   No results for input(s): LABPROT, INR in the last 72 hours.    Assessment: Duodenal ulcers (non-bleeding) and gastritis per EGD 6/2.  Nausea/vomiting resolved on Protonix.  Biopsies pending. -Normal Hgb (13.1) -Normal kidney function (BUN 12/ Cr0.58)  Hypokalemia: 2.9  Plan: OK to discharge from GI standpoint. Continue Protonix 40 mg PO twice daily for 2 months, followed by once daily thereafter.  Advised patient to contact [facility] office to arrange FU  appt.  [facility] will sign off. Please contact us if we can be of any further assistance during this hospital stay.  Edrick Kins PA-C 12/10/2020, 9:03 AM  Contact #  [phone]

## 2020-12-10 NOTE — Discharge Summary (Signed)
Physician Discharge Summary  Blima RichJessica Yeates HYQ:[id number]:[id number] DOB: Feb 07, 1986 DOA: 12/07/2020  PCP: Patient, No Pcp Per (Inactive)  Admit date: 12/07/2020 Discharge date: 12/10/2020  Admitted From: Home Disposition: Home  Recommendations for Outpatient Follow-up:  1. Follow up with PCP in 1 week with repeat CBC/BMP 2. Outpatient follow-up with GI. 3. Abstain from marijuana use 4. Follow up in ED if symptoms worsen or new appear   Home Health: No Equipment/Devices: None  Discharge Condition: Stable CODE STATUS: Full Diet recommendation: Regular  Brief/Interim Summary: 35 year old female with past medical history of chronic abdominal pain, polysubstance abuse, depression, endometriosiss/p partial hysterectomy presented with severe abdominal pain with nausea and vomiting with 1 episode of scant hematemesis.  On presentation, WBC was 23, hemoglobin of 16.1.  Abdominal ultrasound was unremarkable.  CT abdomen and pelvis without contrast was unremarkable for acute findings.  She was started on IV Protonix.  GI was consulted.  She underwent EGD which showed duodenal ulcers.  Subsequently, her condition has improved and she is tolerating diet.  Diet will be advanced today and she will be discharged home today on oral Protonix.  Outpatient follow-up with GI.  Discharge Diagnoses:    Intractable nausea, vomiting with possible upper GI bleeding presenting with hematemesis -Status post EGD  which showed duodenal ulcers.  Subsequently, her condition has improved and she is tolerating diet.  Diet will be advanced today and she will be discharged home today on oral Protonix 40 mg twice a day for 2 months and then once a day for 4 to 6 weeks thereafter as per GI recommendations..  Outpatient follow-up with GI. -Avoid NSAIDs.  Asthma -Not in exacerbation.  Continue as needed albuterol.  Leukocytosis -Possibly reactive.    Resolved  History of polysubstance abuse -Urine drug screen positive for  tetrahydrocannabinol and amphetamine -Counseled regarding abstinence from marijuana use.  Discharge Instructions  Discharge Instructions    Diet general   Complete by: As directed    Increase activity slowly   Complete by: As directed      Allergies as of 12/10/2020      Reactions   Contrast Media [iodinated Diagnostic Agents] Anaphylaxis   Reglan [metoclopramide] Nausea And Vomiting   Latex Rash      Medication List    STOP taking these medications   predniSONE 20 MG tablet Commonly known as: DELTASONE     TAKE these medications   albuterol 108 (90 Base) MCG/ACT inhaler Commonly known as: VENTOLIN HFA Inhale 1-2 puffs into the lungs every 4 (four) hours as needed for wheezing or shortness of breath. Notes to patient: Resume home regimen   ondansetron 4 MG tablet Commonly known as: ZOFRAN Take 1 tablet (4 mg total) by mouth every 6 (six) hours. What changed:   when to take this  reasons to take this Notes to patient: Last dose given 06/02 07:41am   pantoprazole 40 MG tablet Commonly known as: Protonix Take 1 tablet (40 mg total) by mouth 2 (two) times daily before a meal. Protonix 40mg  BID for 2 months, then once daily as per GI recommendations       Follow-up Information    [facility]. Call in 1 day.   Why: Please call to establish care with PCP Contact information: [address] [place] [phone]       Kathi DerBrahmbhatt, Parag, MD. Schedule an appointment as soon as possible for a visit in 1 week(s).   Specialty: Gastroenterology Contact information: [phone]  [address] [place] [address] [phone]              Allergies  Allergen Reactions  . Contrast Media [Iodinated Diagnostic Agents] Anaphylaxis  . Reglan [Metoclopramide] Nausea And Vomiting  . Latex Rash    Consultations:  GI   Procedures/Studies: CT ABDOMEN PELVIS WO CONTRAST  Result Date:  12/08/2020 CLINICAL DATA:  Nausea and vomiting with diarrhea, epigastric pain EXAM: CT ABDOMEN AND PELVIS WITHOUT CONTRAST TECHNIQUE: Multidetector CT imaging of the abdomen and pelvis was performed following the standard protocol without IV contrast. COMPARISON:  01/21/2014 FINDINGS: Lower chest: Minor lateral right base subpleural atelectasis versus scarring. Otherwise no acute lower chest finding. Normal heart size. No pericardial or pleural effusion. Hepatobiliary: Limited without IV contrast. No large focal hepatic abnormality or intrahepatic biliary dilatation. Gallbladder nondistended. Common bile duct nondilated. Pancreas: Unremarkable. No pancreatic ductal dilatation or surrounding inflammatory changes. Spleen: Normal in size without focal abnormality. Adrenals/Urinary Tract: Adrenal glands are unremarkable. Kidneys are normal, without renal calculi, focal lesion, or hydronephrosis. Bladder is unremarkable. Stomach/Bowel: Limited without oral contrast. No significant dilatation, ileus, free air. Portions of the appendix in the right hemipelvis appear normal. No free fluid, fluid collection, hemorrhage, hematoma, abscess or ascites. Vascular/Lymphatic: Minimal aortic atherosclerotic calcifications. No aneurysm. No retroperitoneal hemorrhage or hematoma. Limited assessment without IV contrast. No large bulky adenopathy. Reproductive: Status post hysterectomy. No adnexal masses. Other: No abdominal wall hernia or abnormality. No abdominopelvic ascites. Musculoskeletal: Chronic L5 pars defects with grade 1 anterolisthesis of L5 upon S1 with associated degenerative disc disease. No acute osseous finding. Preserved vertebral body heights. IMPRESSION: No acute intra-abdominal or pelvic finding by noncontrast CT. Chronic L5 pars defects with slight progression of degenerative change and associated grade 1 anterolisthesis of L5 upon S1. Aortic Atherosclerosis (ICD10-I70.0). Electronically Signed   By: Judie Petit.  Shick M.D.    On: 12/08/2020 10:25   US Abdomen Limited  Result Date: 12/08/2020 CLINICAL DATA:  Abdominal pain. EXAM: ULTRASOUND ABDOMEN LIMITED RIGHT UPPER QUADRANT COMPARISON:  CT 01/21/2014. FINDINGS: Gallbladder: No gallstones or wall thickening visualized. No sonographic Murphy sign noted by sonographer. Common bile duct: Diameter: 3.9 mm Liver: No focal lesion identified. Within normal limits in parenchymal echogenicity. Portal vein is patent on color Doppler imaging with normal direction of blood flow towards the liver. Other: None. IMPRESSION: Negative exam.  No gallstones or biliary distention. Electronically Signed   By: Maisie Fus  Register   On: 12/08/2020 07:45    EGD:  Findings ----------- -EGD showed minimal esophagitis and gastritis and few linear duodenal bulb ulcers.  Most likely NSAID induced ulcers.    Subjective: Patient seen and examined at bedside.  She feels much better and is tolerating diet.  She wants to go home today.  No overnight fever, vomiting or worsening shortness of breath reported.  Discharge Exam: Vitals:   12/09/20 2219 12/10/20 0534  BP: 139/89 122/88  Pulse: 61 64  Resp: 18 18  Temp: 99.1 F (37.3 C) 98.7 F (37.1 C)  SpO2: 99% 99%    General: Pt is alert, awake, not in acute distress Cardiovascular: rate controlled, S1/S2 + Respiratory: bilateral decreased breath sounds at bases Abdominal: Soft, NT, ND, bowel sounds + Extremities: no edema, no cyanosis    The results of significant diagnostics from this hospitalization (including imaging, microbiology, ancillary and laboratory) are listed below for reference.     Microbiology: Recent Results (from the past 240 hour(s))  SARS CORONAVIRUS 2 (TAT 6-24 HRS) Nasopharyngeal Nasopharyngeal  Swab     Status: None   Collection Time: 11/30/20  8:00 PM   Specimen: Nasopharyngeal Swab  Result Value Ref Range Status   SARS Coronavirus 2 NEGATIVE NEGATIVE Final    Comment: (NOTE) SARS-CoV-2 target nucleic  acids are NOT DETECTED.  The SARS-CoV-2 RNA is generally detectable in upper and lower respiratory specimens during the acute phase of infection. Negative results do not preclude SARS-CoV-2 infection, do not rule out co-infections with other pathogens, and should not be used as the sole basis for treatment or other patient management decisions. Negative results must be combined with clinical observations, patient history, and epidemiological information. The expected result is Negative.  Fact Sheet for Patients: HairSlick.no  Fact Sheet for Healthcare Providers: quierodirigir.com  This test is not yet approved or cleared by the [place] FDA and  has been authorized for detection and/or diagnosis of SARS-CoV-2 by FDA under an Emergency Use Authorization (EUA). This EUA will remain  in effect (meaning this test can be used) for the duration of the COVID-19 declaration under Se ction 564(b)(1) of the Act, 21 U.S.C. section 360bbb-3(b)(1), unless the authorization is terminated or revoked sooner.  Performed at [facility], [address] [place], [place] [address]   Resp Panel by RT-PCR (Flu A&B, Covid) Nasopharyngeal Swab     Status: None   Collection Time: 12/08/20 11:37 AM   Specimen: Nasopharyngeal Swab; Nasopharyngeal(NP) swabs in vial transport medium  Result Value Ref Range Status   SARS Coronavirus 2 by RT PCR NEGATIVE NEGATIVE Final    Comment: (NOTE) SARS-CoV-2 target nucleic acids are NOT DETECTED.  The SARS-CoV-2 RNA is generally detectable in upper respiratory specimens during the acute phase of infection. The lowest concentration of SARS-CoV-2 viral copies this assay can detect is 138 copies/mL. A negative result does not preclude SARS-Cov-2 infection and should not be used as the sole basis for treatment or other patient management decisions. A negative result may occur with  improper specimen  collection/handling, submission of specimen other than nasopharyngeal swab, presence of viral mutation(s) within the areas targeted by this assay, and inadequate number of viral copies(<138 copies/mL). A negative result must be combined with clinical observations, patient history, and epidemiological information. The expected result is Negative.  Fact Sheet for Patients:  BloggerCourse.com  Fact Sheet for Healthcare Providers:  SeriousBroker.it  This test is no t yet approved or cleared by the [place] FDA and  has been authorized for detection and/or diagnosis of SARS-CoV-2 by FDA under an Emergency Use Authorization (EUA). This EUA will remain  in effect (meaning this test can be used) for the duration of the COVID-19 declaration under Section 564(b)(1) of the Act, 21 U.S.C.section 360bbb-3(b)(1), unless the authorization is terminated  or revoked sooner.       Influenza A by PCR NEGATIVE NEGATIVE Final   Influenza B by PCR NEGATIVE NEGATIVE Final    Comment: (NOTE) The Xpert Xpress SARS-CoV-2/FLU/RSV plus assay is intended as an aid in the diagnosis of influenza from Nasopharyngeal swab specimens and should not be used as a sole basis for treatment. Nasal washings and aspirates are unacceptable for Xpert Xpress SARS-CoV-2/FLU/RSV testing.  Fact Sheet for Patients: BloggerCourse.com  Fact Sheet for Healthcare Providers: SeriousBroker.it  This test is not yet approved or cleared by the [place] FDA and has been authorized for detection and/or diagnosis of SARS-CoV-2 by FDA under an Emergency Use Authorization (EUA). This EUA will remain in effect (meaning this test can be  used) for the duration of the COVID-19 declaration under Section 564(b)(1) of the Act, 21 U.S.C. section 360bbb-3(b)(1), unless the authorization is terminated or revoked.  Performed at [facility], [address] [place], [place] [address]   Wet prep, genital     Status: Abnormal   Collection Time: 12/08/20 11:46 AM  Result Value Ref Range Status   Yeast Wet Prep HPF POC NONE SEEN NONE SEEN Final   Trich, Wet Prep NONE SEEN NONE SEEN Final   Clue Cells Wet Prep HPF POC NONE SEEN NONE SEEN Final   WBC, Wet Prep HPF POC MANY (A) NONE SEEN Final   Sperm NONE SEEN  Final    Comment: Performed at [facility], [address], [place], [place] [address]     Labs: BNP (last 3 results) No results for input(s): BNP in the last 8760 hours. Basic Metabolic Panel: Recent Labs  Lab 12/07/20 2249 12/09/20 0419 12/10/20 0349  NA 143 137 136  K 3.9 3.7 2.9*  CL 107 103 103  CO2 GLUCOSE 135* 98 99  BUN 23* 17 12  CREATININE 0.78 0.56 0.58  CALCIUM 9.9 9.2 8.9  MG  --   --  1.9   Liver Function Tests: Recent Labs  Lab 12/07/20 2249  AST 29  ALT 28  ALKPHOS 76  BILITOT 0.1*  PROT 8.4*  ALBUMIN 4.7   Recent Labs  Lab 12/07/20 2249  LIPASE 21   No results for input(s): AMMONIA in the last 168 hours. CBC: Recent Labs  Lab 12/07/20 2249 12/09/20 0419 12/10/20 0349  WBC 23.0* 12.1* 10.0  HGB 16.1* 12.5 13.1  HCT 49.3* 36.9 38.6  MCV 93.7 90.0 89.6  PLT 217 218 204   Cardiac Enzymes: No results for input(s): CKTOTAL, CKMB, CKMBINDEX, TROPONINI in the last 168 hours. BNP: Invalid input(s): POCBNP CBG: No results for input(s): GLUCAP in the last 168 hours. D-Dimer No results for input(s): DDIMER in the last 72 hours. Hgb A1c No results for input(s): HGBA1C in the last 72 hours. Lipid Profile No results for input(s): CHOL, HDL, LDLCALC, TRIG, CHOLHDL, LDLDIRECT in the last 72 hours. Thyroid function studies No results for input(s): TSH, T4TOTAL, T3FREE, THYROIDAB in the last 72 hours.  Invalid input(s): FREET3 Anemia work up No results for input(s): VITAMINB12, FOLATE, FERRITIN, TIBC, IRON, RETICCTPCT in  the last 72 hours. Urinalysis    Component Value Date/Time   COLORURINE YELLOW 12/08/2020 0917   APPEARANCEUR HAZY (A) 12/08/2020 0917   LABSPEC 1.026 12/08/2020 0917   PHURINE 6.0 12/08/2020 0917   GLUCOSEU NEGATIVE 12/08/2020 0917   HGBUR NEGATIVE 12/08/2020 0917   BILIRUBINUR NEGATIVE 12/08/2020 0917   KETONESUR 5 (A) 12/08/2020 0917   PROTEINUR 30 (A) 12/08/2020 0917   UROBILINOGEN 1.0 01/28/2014 1030   NITRITE NEGATIVE 12/08/2020 0917   LEUKOCYTESUR NEGATIVE 12/08/2020 0917   Sepsis Labs Invalid input(s): PROCALCITONIN,  WBC,  LACTICIDVEN Microbiology Recent Results (from the past 240 hour(s))  SARS CORONAVIRUS 2 (TAT 6-24 HRS) Nasopharyngeal Nasopharyngeal Swab     Status: None   Collection Time: 11/30/20  8:00 PM   Specimen: Nasopharyngeal Swab  Result Value Ref Range Status   SARS Coronavirus 2 NEGATIVE NEGATIVE Final    Comment: (NOTE) SARS-CoV-2 target nucleic acids are NOT DETECTED.  The SARS-CoV-2 RNA is generally detectable in upper and lower respiratory specimens during the acute phase of infection. Negative results do not preclude SARS-CoV-2 infection, do not rule  out co-infections with other pathogens, and should not be used as the sole basis for treatment or other patient management decisions. Negative results must be combined with clinical observations, patient history, and epidemiological information. The expected result is Negative.  Fact Sheet for Patients: HairSlick.no  Fact Sheet for Healthcare Providers: quierodirigir.com  This test is not yet approved or cleared by the [place] FDA and  has been authorized for detection and/or diagnosis of SARS-CoV-2 by FDA under an Emergency Use Authorization (EUA). This EUA will remain  in effect (meaning this test can be used) for the duration of the COVID-19 declaration under Se ction 564(b)(1) of the Act, 21 U.S.C. section 360bbb-3(b)(1), unless  the authorization is terminated or revoked sooner.  Performed at [facility], [address] [place], [place] [address]   Resp Panel by RT-PCR (Flu A&B, Covid) Nasopharyngeal Swab     Status: None   Collection Time: 12/08/20 11:37 AM   Specimen: Nasopharyngeal Swab; Nasopharyngeal(NP) swabs in vial transport medium  Result Value Ref Range Status   SARS Coronavirus 2 by RT PCR NEGATIVE NEGATIVE Final    Comment: (NOTE) SARS-CoV-2 target nucleic acids are NOT DETECTED.  The SARS-CoV-2 RNA is generally detectable in upper respiratory specimens during the acute phase of infection. The lowest concentration of SARS-CoV-2 viral copies this assay can detect is 138 copies/mL. A negative result does not preclude SARS-Cov-2 infection and should not be used as the sole basis for treatment or other patient management decisions. A negative result may occur with  improper specimen collection/handling, submission of specimen other than nasopharyngeal swab, presence of viral mutation(s) within the areas targeted by this assay, and inadequate number of viral copies(<138 copies/mL). A negative result must be combined with clinical observations, patient history, and epidemiological information. The expected result is Negative.  Fact Sheet for Patients:  BloggerCourse.com  Fact Sheet for Healthcare Providers:  SeriousBroker.it  This test is no t yet approved or cleared by the [place] FDA and  has been authorized for detection and/or diagnosis of SARS-CoV-2 by FDA under an Emergency Use Authorization (EUA). This EUA will remain  in effect (meaning this test can be used) for the duration of the COVID-19 declaration under Section 564(b)(1) of the Act, 21 U.S.C.section 360bbb-3(b)(1), unless the authorization is terminated  or revoked sooner.       Influenza A by PCR NEGATIVE NEGATIVE Final   Influenza B by PCR NEGATIVE NEGATIVE Final     Comment: (NOTE) The Xpert Xpress SARS-CoV-2/FLU/RSV plus assay is intended as an aid in the diagnosis of influenza from Nasopharyngeal swab specimens and should not be used as a sole basis for treatment. Nasal washings and aspirates are unacceptable for Xpert Xpress SARS-CoV-2/FLU/RSV testing.  Fact Sheet for Patients: BloggerCourse.com  Fact Sheet for Healthcare Providers: SeriousBroker.it  This test is not yet approved or cleared by the [place] FDA and has been authorized for detection and/or diagnosis of SARS-CoV-2 by FDA under an Emergency Use Authorization (EUA). This EUA will remain in effect (meaning this test can be used) for the duration of the COVID-19 declaration under Section 564(b)(1) of the Act, 21 U.S.C. section 360bbb-3(b)(1), unless the authorization is terminated or revoked.  Performed at [facility], [address], [place], [place] [address]   Wet prep, genital     Status: Abnormal   Collection Time: 12/08/20 11:46 AM  Result Value Ref Range Status   Yeast Wet Prep HPF POC NONE SEEN NONE SEEN Final  Trich, Wet Prep NONE SEEN NONE SEEN Final   Clue Cells Wet Prep HPF POC NONE SEEN NONE SEEN Final   WBC, Wet Prep HPF POC MANY (A) NONE SEEN Final   Sperm NONE SEEN  Final    Comment: Performed at [facility], [address] [place], [place] [address]     Time coordinating discharge: 35 minutes  SIGNED:   Glade Lloyd, MD  [facility] 12/10/2020, 10:58 AM

## 2020-12-10 NOTE — Plan of Care (Signed)

## 2020-12-10 NOTE — Progress Notes (Signed)
Provided discharge education/instructions, all questions and concerns addressed, Pt states she feels so much better, ready to go home and had been tolerating soft diet well. Pt not in distress, to discharge home with belongings accompanied by her mother.

## 2021-05-11 ENCOUNTER — Other Ambulatory Visit: Payer: Self-pay

## 2021-05-11 ENCOUNTER — Encounter: Payer: Self-pay | Admitting: Emergency Medicine

## 2021-05-11 ENCOUNTER — Emergency Department
Admission: EM | Admit: 2021-05-11 | Discharge: 2021-05-11 | Disposition: A | Discharge status: Home / Self Care | Payer: Medicaid Other | Source: Home / Self Care | Attending: Family Medicine | Admitting: Family Medicine

## 2021-05-11 DIAGNOSIS — R062 Wheezing: Secondary | ICD-10-CM

## 2021-05-11 DIAGNOSIS — J4521 Mild intermittent asthma with (acute) exacerbation: Secondary | ICD-10-CM

## 2021-05-11 MED ORDER — PREDNISONE 20 MG PO TABS: 20.0000 mg | ORAL_TABLET | Freq: Two times a day (BID) | ORAL | 0 refills | Status: DC

## 2021-05-11 MED ORDER — ALBUTEROL SULFATE HFA 108 (90 BASE) MCG/ACT IN AERS: 1.0000 | INHALATION_SPRAY | RESPIRATORY_TRACT | 0 refills | Status: DC | PRN

## 2021-05-11 MED ORDER — ALBUTEROL SULFATE HFA 108 (90 BASE) MCG/ACT IN AERS: 2.0000 | INHALATION_SPRAY | Freq: Once | RESPIRATORY_TRACT | Status: DC

## 2021-05-11 NOTE — ED Provider Notes (Signed)
[facility]    CSN: [id number] Arrival date & time: 05/11/21  1538      History   Chief Complaint Chief Complaint  Patient presents with   Asthma    HPI [name] is a 35 y.o. female.   HPI  Patient states that she has chronic asthma.  Usually intermittent.  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  She thinks that this flared up her asthma.  She has run out of her albuterol.  Is here for an albuterol refill.  Also feels short of breath, is hoping for prednisone.  No fever or chills.  Past Medical History:  Diagnosis Date   Chronic abdominal pain    Cocaine abuse (HCC)    Depression    Drug-seeking behavior    Endometriosis    GERD (gastroesophageal reflux disease)    Homelessness    Hypertension    Polysubstance abuse [facility])     Patient Active Problem List   Diagnosis Date Noted   GI bleed 12/09/2020   Intractable nausea and vomiting 12/08/2020   Asthma 12/08/2020   Drug-seeking behavior 01/21/2014   DEPRESSION 05/05/2009   HYPERTENSION 05/05/2009   GERD 05/05/2009   ABDOMINAL PAIN, CHRONIC 05/05/2009    Past Surgical History:  Procedure Laterality Date   BIOPSY  12/09/2020   Procedure: BIOPSY;  Surgeon: Kathi Der, MD;  Location: [facility];  Service: Gastroenterology;;   ESOPHAGOGASTRODUODENOSCOPY N/A 12/09/2020   Procedure: ESOPHAGOGASTRODUODENOSCOPY (EGD);  Surgeon: Kathi Der, MD;  Location: [facility];  Service: Gastroenterology;  Laterality: N/A;   PARTIAL HYSTERECTOMY  2009   left her ovaries   TONSILLECTOMY      OB History     Gravida  2   Para  2   Term  2   Preterm  0   AB  0   Living  2      SAB  0   IAB  0   Ectopic  0   Multiple  0   Live Births  2            Home Medications    Prior to Admission medications   Medication Sig Start Date End Date Taking? Authorizing Provider  predniSONE (DELTASONE) 20 MG tablet Take 1 tablet (20 mg  total) by mouth 2 (two) times daily with a meal. 05/11/21  Yes Eustace Moore, MD  albuterol (VENTOLIN HFA) 108 (90 Base) MCG/ACT inhaler Inhale 1-2 puffs into the lungs every 4 (four) hours as needed for wheezing or shortness of breath. 05/11/21   Eustace Moore, MD  ondansetron (ZOFRAN) 4 MG tablet Take 1 tablet (4 mg total) by mouth every 6 (six) hours. 12/08/20   Theron Arista, PA-C  pantoprazole (PROTONIX) 40 MG tablet Take 1 tablet (40 mg total) by mouth 2 (two) times daily before a meal. Protonix 40mg  BID for 2 months, then once daily as per GI recommendations 12/10/20 01/09/21  03/12/21, MD    Family History History reviewed. No pertinent family history.  Social History Social History   Tobacco Use   Smoking status: Every Day    Packs/day: 0.50    Types: Cigarettes   Smokeless tobacco: Never  Vaping Use   Vaping Use: Never used  Substance Use Topics   Alcohol use: No   Drug use: Yes    Types: Marijuana    Comment: Narcotics     Allergies   Contrast media [iodinated  diagnostic agents], Gabapentin, Reglan [metoclopramide], and Latex   Review of Systems Review of Systems See H  Physical Exam Triage Vital Signs ED Triage Vitals  Enc Vitals Group     BP 05/11/21 1550 135/84     Pulse Rate 05/11/21 1550 100     Resp 05/11/21 1550 18     Temp 05/11/21 1550 98.5 F (36.9 C)     Temp Source 05/11/21 1550 Oral     SpO2 05/11/21 1550 98 %     Weight 05/11/21 1553 148 lb (67.1 kg)     Height --      Head Circumference --      Peak Flow --      Pain Score 05/11/21 1552 0     Pain Loc --      Pain Edu? --      Excl. in GC? --    No data found.  Updated Vital Signs BP 135/84 (BP Location: Right Arm)   Pulse 100   Temp 98.5 F (36.9 C) (Oral)   Resp 18   Wt 67.1 kg   SpO2 98%   BMI 22.50 kg/m      Physical Exam Constitutional:      General: She is not in acute distress.    Appearance: She is well-developed and normal weight.  HENT:     Head:  Normocephalic and atraumatic.     Mouth/Throat:     Comments: Mask is in place Eyes:     Conjunctiva/sclera: Conjunctivae normal.     Pupils: Pupils are equal, round, and reactive to light.  Cardiovascular:     Rate and Rhythm: Normal rate.  Pulmonary:     Effort: Pulmonary effort is normal. No respiratory distress.     Breath sounds: Wheezing present.     Comments: Inspiratory wheeze throughout Abdominal:     General: There is no distension.     Palpations: Abdomen is soft.  Musculoskeletal:        General: Normal range of motion.     Cervical back: Normal range of motion.  Skin:    General: Skin is warm and dry.  Neurological:     Mental Status: She is alert.  Psychiatric:        Mood and Affect: Mood normal.        Behavior: Behavior normal.     UC Treatments / Results  Labs (all labs ordered are listed, but only abnormal results are displayed) Labs Reviewed - No data to display  EKG   Radiology No results found.  Procedures Procedures (including critical care time)  Medications Ordered in UC Medications  albuterol (VENTOLIN HFA) 108 (90 Base) MCG/ACT inhaler 2 puff (has no administration in time range)    Initial Impression / Assessment and Plan / UC Course  I have reviewed the triage vital signs and the nursing notes.  Pertinent labs & imaging results that were available during my care of the patient were reviewed by me and considered in my medical decision making (see chart for details).      Final Clinical Impressions(s) / UC Diagnoses   Final diagnoses:  Mild intermittent asthma with exacerbation  Wheezing     Discharge Instructions      Take prednisone 2 times a day for 5 days Use albuterol as needed Congratulations on your efforts to quit smoking   ED Prescriptions     Medication Sig Dispense Auth. Provider   albuterol (VENTOLIN HFA) 108 (90 Base) MCG/ACT inhaler Inhale  1-2 puffs into the lungs every 4 (four) hours as needed for  wheezing or shortness of breath. 18 g Eustace Moore, MD   predniSONE (DELTASONE) 20 MG tablet Take 1 tablet (20 mg total) by mouth 2 (two) times daily with a meal. 10 tablet Eustace Moore, MD      PDMP not reviewed this encounter.   Eustace Moore, MD 05/11/21 [phone]

## 2021-05-11 NOTE — ED Triage Notes (Signed)
Pt states she has hx of asthma and is running out of her albuterol inhaler and nebulizer solution. She has been having asthma exacerbation. States her insurance is not in effect for another 2 months.

## 2021-05-11 NOTE — Discharge Instructions (Signed)
Take prednisone 2 times a day for 5 days Use albuterol as needed Congratulations on your efforts to quit smoking

## 2022-08-22 ENCOUNTER — Telehealth: Payer: Self-pay

## 2022-08-22 NOTE — Telephone Encounter (Signed)
Mychart msg sent. AS, CMA

## 2023-04-04 ENCOUNTER — Emergency Department (HOSPITAL_COMMUNITY)
Admission: EM | Admit: 2023-04-04 | Discharge: 2023-04-04 | Disposition: A | Discharge status: Home / Self Care | Payer: Medicaid Other | Attending: Emergency Medicine | Admitting: Emergency Medicine

## 2023-04-04 ENCOUNTER — Encounter (HOSPITAL_COMMUNITY): Payer: Self-pay

## 2023-04-04 ENCOUNTER — Emergency Department (HOSPITAL_COMMUNITY): Payer: Medicaid Other

## 2023-04-04 ENCOUNTER — Other Ambulatory Visit: Payer: Self-pay

## 2023-04-04 DIAGNOSIS — M546 Pain in thoracic spine: Secondary | ICD-10-CM | POA: Diagnosis not present

## 2023-04-04 DIAGNOSIS — Z20822 Contact with and (suspected) exposure to covid-19: Secondary | ICD-10-CM | POA: Diagnosis not present

## 2023-04-04 DIAGNOSIS — J4521 Mild intermittent asthma with (acute) exacerbation: Secondary | ICD-10-CM | POA: Insufficient documentation

## 2023-04-04 DIAGNOSIS — I10 Essential (primary) hypertension: Secondary | ICD-10-CM | POA: Insufficient documentation

## 2023-04-04 DIAGNOSIS — Z91040 Latex allergy status: Secondary | ICD-10-CM | POA: Insufficient documentation

## 2023-04-04 DIAGNOSIS — R509 Fever, unspecified: Secondary | ICD-10-CM | POA: Insufficient documentation

## 2023-04-04 DIAGNOSIS — R059 Cough, unspecified: Secondary | ICD-10-CM | POA: Diagnosis not present

## 2023-04-04 LAB — COMPREHENSIVE METABOLIC PANEL
ALT: 18 U/L (ref 0–44)
AST: 16 U/L (ref 15–41)
Albumin: 4.2 g/dL (ref 3.5–5.0)
Alkaline Phosphatase: 80 U/L (ref 38–126)
Anion gap: 8 (ref 5–15)
BUN: 8 mg/dL (ref 6–20)
CO2: 29 mmol/L (ref 22–32)
Calcium: 9.3 mg/dL (ref 8.9–10.3)
Chloride: 99 mmol/L (ref 98–111)
Creatinine, Ser: 0.66 mg/dL (ref 0.44–1.00)
GFR, Estimated: >60 mL/min (ref >60)
Glucose, Bld: 77 mg/dL (ref 70–99)
Potassium: 3.6 mmol/L (ref 3.5–5.1)
Sodium: 136 mmol/L (ref 135–145)
Total Bilirubin: 0.3 mg/dL (ref 0.3–1.2)
Total Protein: 7.6 g/dL (ref 6.5–8.1)

## 2023-04-04 LAB — CBC WITH DIFFERENTIAL/PLATELET
Abs Immature Granulocytes: 0.03 10*3/uL (ref 0.00–0.07)
Basophils Absolute: 0 10*3/uL (ref 0.0–0.1)
Basophils Relative: 0 %
Eosinophils Absolute: 0.1 10*3/uL (ref 0.0–0.5)
Eosinophils Relative: 1 %
HCT: 49.4 % — ABNORMAL HIGH (ref 36.0–46.0)
Hemoglobin: 15.6 g/dL — ABNORMAL HIGH (ref 12.0–15.0)
Immature Granulocytes: 0 %
Lymphocytes Relative: 19 %
Lymphs Abs: 2 10*3/uL (ref 0.7–4.0)
MCH: 29.5 pg (ref 26.0–34.0)
MCHC: 31.6 g/dL (ref 30.0–36.0)
MCV: 93.6 fL (ref 80.0–100.0)
Monocytes Absolute: 0.6 10*3/uL (ref 0.1–1.0)
Monocytes Relative: 6 %
Neutro Abs: 7.3 10*3/uL (ref 1.7–7.7)
Neutrophils Relative %: 74 %
Platelets: 239 10*3/uL (ref 150–400)
RBC: 5.28 MIL/uL — ABNORMAL HIGH (ref 3.87–5.11)
RDW: 11.9 % (ref 11.5–15.5)
WBC: 10.1 10*3/uL (ref 4.0–10.5)
nRBC: 0 % (ref 0.0–0.2)

## 2023-04-04 LAB — SARS CORONAVIRUS 2 BY RT PCR: SARS Coronavirus 2 by RT PCR: NEGATIVE

## 2023-04-04 MED ORDER — AZITHROMYCIN 250 MG PO TABS: 250.0000 mg | ORAL_TABLET | Freq: Every day | ORAL | 0 refills | Status: DC

## 2023-04-04 MED ORDER — ALBUTEROL SULFATE HFA 108 (90 BASE) MCG/ACT IN AERS
2.0000 | INHALATION_SPRAY | Freq: Once | RESPIRATORY_TRACT | Status: AC
  Administered 2023-04-04: 2 via RESPIRATORY_TRACT
  Filled 2023-04-04: qty 6.7

## 2023-04-04 MED ORDER — BENZONATATE 100 MG PO CAPS: 100.0000 mg | ORAL_CAPSULE | Freq: Three times a day (TID) | ORAL | 0 refills | Status: DC

## 2023-04-04 NOTE — ED Provider Notes (Signed)
[place] EM[facility] DEPARTMENT AT [facility] Provider Note   CSN: [id number] Arrival date & time: 04/04/23  1105     History  Chief Complaint  Patient presents with   Fever   Cough    [name] is a 37 y.o. female with history of endometriosis, GERD, hypertension, depression, chronic abdominal pain, polysubstance abuse, who presents the emergency department complaining of cough, nasal congestion, fever, and right upper back pain.  Last week while she was at work in [employer], she ingested brackish water.  She thinks that is what is causing her symptoms today.  She also has a history of asthma and is requesting a refill of her albuterol inhaler.   Fever Associated symptoms: congestion and cough   Cough Associated symptoms: fever and shortness of breath        Home Medications Prior to Admission medications   Medication Sig Start Date End Date Taking? Authorizing Provider  azithromycin (ZITHROMAX) 250 MG tablet Take 1 tablet (250 mg total) by mouth daily. Take first 2 tablets together, then 1 every day until finished. 04/04/23  Yes Ivaan Liddy T, PA-C  benzonatate (TESSALON) 100 MG capsule Take 1 capsule (100 mg total) by mouth every 8 (eight) hours. 04/04/23  Yes Rayjon Wery T, PA-C  albuterol (VENTOLIN HFA) 108 (90 Base) MCG/ACT inhaler Inhale 1-2 puffs into the lungs every 4 (four) hours as needed for wheezing or shortness of breath. 05/11/21   Eustace Moore, MD  ondansetron (ZOFRAN) 4 MG tablet Take 1 tablet (4 mg total) by mouth every 6 (six) hours. 12/08/20   Theron Arista, PA-C  pantoprazole (PROTONIX) 40 MG tablet Take 1 tablet (40 mg total) by mouth 2 (two) times daily before a meal. Protonix 40mg  BID for 2 months, then once daily as per GI recommendations 12/10/20 01/09/21  Glade Lloyd, MD  predniSONE (DELTASONE) 20 MG tablet Take 1 tablet (20 mg total) by mouth 2 (two) times daily with a meal. 05/11/21   Eustace Moore, MD      Allergies     Contrast media [iodinated contrast media], Gabapentin, Reglan [metoclopramide], and Latex    Review of Systems   Review of Systems  Constitutional:  Positive for fever.  HENT:  Positive for congestion.   Respiratory:  Positive for cough and shortness of breath.   All other systems reviewed and are negative.   Physical Exam Updated Vital Signs BP 136/73 (BP Location: Right Arm)   Pulse 84   Temp 98.6 F (37 C) (Oral)   Resp 12   Ht 5\' 9"  (1.753 m)   Wt 58.1 kg   SpO2 100%   BMI 18.90 kg/m  Physical Exam Vitals and nursing note reviewed.  Constitutional:      Appearance: Normal appearance.  HENT:     Head: Normocephalic and atraumatic.     Nose: Congestion present.  Eyes:     Conjunctiva/sclera: Conjunctivae normal.  Cardiovascular:     Rate and Rhythm: Normal rate and regular rhythm.  Pulmonary:     Effort: Pulmonary effort is normal. No respiratory distress.     Breath sounds: Examination of the right-middle field reveals wheezing. Wheezing present.     Comments: Mild expiratory wheezing, slightly worse in right middle lobe Abdominal:     General: There is no distension.     Palpations: Abdomen is soft.     Tenderness: There is no abdominal tenderness.  Skin:    General: Skin is warm and dry.  Neurological:  General: No focal deficit present.     Mental Status: She is alert.     ED Results / Procedures / Treatments   Labs (all labs ordered are listed, but only abnormal results are displayed) Labs Reviewed  CBC WITH DIFFERENTIAL/PLATELET - Abnormal; Notable for the following components:      Result Value   RBC 5.28 (*)    Hemoglobin 15.6 (*)    HCT 49.4 (*)    All other components within normal limits  SARS CORONAVIRUS 2 BY RT PCR  COMPREHENSIVE METABOLIC PANEL    EKG None  Radiology DG Chest 2 View  Result Date: 04/04/2023 CLINICAL DATA:  Fever and cough for 1 week EXAM: CHEST - 2 VIEW COMPARISON:  04/06/2020 FINDINGS: The heart size and  mediastinal contours are within normal limits. Both lungs are clear. The visualized skeletal structures are unremarkable. IMPRESSION: No active cardiopulmonary disease. Electronically Signed   By: Judie Petit.  Shick M.D.   On: 04/04/2023 13:27    Procedures Procedures    Medications Ordered in ED Medications  albuterol (VENTOLIN HFA) 108 (90 Base) MCG/ACT inhaler 2 puff (2 puffs Inhalation Given 04/04/23 1352)    ED Course/ Medical Decision Making/ A&P                                 Medical Decision Making Amount and/or Complexity of Data Reviewed Labs: ordered. Radiology: ordered.  Risk Prescription drug management.   This patient is a 37 y.o. female  who presents to the ED for concern of cough, fever since yesterday.   Differential diagnoses prior to evaluation: The emergent differential diagnosis includes, but is not limited to,  Upper respiratory infection, lower respiratory infection, allergies, asthma, irritants, foreign body, medications (ACE inhibitors), reflux, CHF, lung cancer, interstitial lung disease, psychiatric causes, postnasal drip. This is not an exhaustive differential.   Past Medical History / Co-morbidities / Social History: Asthma, endometriosis, GERD, hypertension, depression, chronic abdominal pain, polysubstance abuse  Additional history: Chart reviewed. Pertinent results include: Reviewed chart with scattered ER visits in the past few years for bronchitis and asthma exacerbations  Physical Exam: Physical exam performed. The pertinent findings include: Normal vital signs, no acute distress.  Mild expiratory wheezing in all lung fields, worse in R middle lobe.  Lab Tests/Imaging studies: I personally interpreted labs/imaging and the pertinent results include:  CBC and CMP unremarkable. Negative COVID.  CXR without acute abnormalities. I agree with the radiologist interpretation.  Medications: I ordered medication including albuterol inhaler.  I have reviewed  the patients home medicines and have made adjustments as needed.   Disposition: After consideration of the diagnostic results and the patients response to treatment, I feel that emergency department workup does not suggest an emergent condition requiring admission or immediate intervention beyond what has been performed at this time. The plan is: discharge to home with treatment of likely asthma exacerbation brought on by respiratory illness. Suspect likely viral however patient with some focal lung sounds that raise my concern for possible pneumonia missed on CXR. Will prescribe antibiotics and tessalon. Pt taking albuterol inhaler in ER home with her. The patient is safe for discharge and has been instructed to return immediately for worsening symptoms, change in symptoms or any other concerns.  Final Clinical Impression(s) / ED Diagnoses Final diagnoses:  Mild intermittent asthma with exacerbation    Rx / DC Orders ED Discharge Orders  Ordered    benzonatate (TESSALON) 100 MG capsule  Every 8 hours        04/04/23 1347    azithromycin (ZITHROMAX) 250 MG tablet  Daily        04/04/23 1347           Portions of this report may have been transcribed using voice recognition software. Every effort was made to ensure accuracy; however, inadvertent computerized transcription errors may be present.    Su Monks, PA-C 04/04/23 1652    Sloan Leiter, DO 04/05/23 [phone]

## 2023-04-04 NOTE — Discharge Instructions (Addendum)
You were seen in the ER for fever and cough.  As we discussed, your blood work was reassuring. Your COVID test was negative. The radiologist did not see pneumonia on your x-ray but with your history of asthma, I want to cover for possible bacterial pneumonia.  I'm prescribing you a course of antibiotics and a cough medication. You can use the inhaler given here as needed.   Continue to monitor how you're doing and return to the ER for new or worsening symptoms.  I've attached some options for mental health resources as we discussed.

## 2023-04-04 NOTE — ED Notes (Signed)
Patient transported to X-ray 

## 2023-04-04 NOTE — ED Triage Notes (Signed)
Pt complains of fever and right upper back pain. Cough, Chills and emesis yesterday. Pt states last week when working in [employer] ingested bad water and thinks that is what causes the symptoms. Pt states feel like right ear is clogged up.

## 2023-07-12 ENCOUNTER — Encounter (HOSPITAL_COMMUNITY): Payer: Self-pay | Admitting: Emergency Medicine

## 2023-07-12 ENCOUNTER — Ambulatory Visit (HOSPITAL_COMMUNITY)
Admission: EM | Admit: 2023-07-12 | Discharge: 2023-07-12 | Disposition: A | Discharge status: Home / Self Care | Payer: Medicaid Other | Attending: Internal Medicine | Admitting: Internal Medicine

## 2023-07-12 ENCOUNTER — Other Ambulatory Visit: Payer: Self-pay

## 2023-07-12 DIAGNOSIS — J45998 Other asthma: Secondary | ICD-10-CM | POA: Diagnosis not present

## 2023-07-12 DIAGNOSIS — J4541 Moderate persistent asthma with (acute) exacerbation: Secondary | ICD-10-CM | POA: Diagnosis not present

## 2023-07-12 HISTORY — DX: Unspecified asthma, uncomplicated: J45.909

## 2023-07-12 MED ORDER — ALBUTEROL SULFATE (2.5 MG/3ML) 0.083% IN NEBU: 2.5000 mg | INHALATION_SOLUTION | Freq: Four times a day (QID) | RESPIRATORY_TRACT | 0 refills | Status: AC | PRN

## 2023-07-12 MED ORDER — METHYLPREDNISOLONE SODIUM SUCC 125 MG IJ SOLR
60.0000 mg | Freq: Once | INTRAMUSCULAR | Status: AC
  Administered 2023-07-12: 60 mg via INTRAMUSCULAR

## 2023-07-12 MED ORDER — IPRATROPIUM-ALBUTEROL 0.5-2.5 (3) MG/3ML IN SOLN
RESPIRATORY_TRACT | Status: AC
  Filled 2023-07-12: qty 3

## 2023-07-12 MED ORDER — METHYLPREDNISOLONE SODIUM SUCC 125 MG IJ SOLR
INTRAMUSCULAR | Status: AC
  Filled 2023-07-12: qty 2

## 2023-07-12 MED ORDER — IPRATROPIUM-ALBUTEROL 0.5-2.5 (3) MG/3ML IN SOLN
3.0000 mL | Freq: Once | RESPIRATORY_TRACT | Status: AC
  Administered 2023-07-12: 3 mL via RESPIRATORY_TRACT

## 2023-07-12 MED ORDER — METHYLPREDNISOLONE SODIUM SUCC 125 MG IJ SOLR: 60.0000 mg | Freq: Once | INTRAMUSCULAR | Status: DC

## 2023-07-12 MED ORDER — ALBUTEROL SULFATE HFA 108 (90 BASE) MCG/ACT IN AERS
2.0000 | INHALATION_SPRAY | Freq: Four times a day (QID) | RESPIRATORY_TRACT | Status: DC | PRN
  Administered 2023-07-12: 2 via RESPIRATORY_TRACT

## 2023-07-12 MED ORDER — ALBUTEROL SULFATE HFA 108 (90 BASE) MCG/ACT IN AERS
INHALATION_SPRAY | RESPIRATORY_TRACT | Status: AC
  Filled 2023-07-12: qty 6.7

## 2023-07-12 NOTE — ED Triage Notes (Signed)
 Pt states she is having a asthma flare up and cough for 3 days.

## 2023-07-12 NOTE — ED Provider Notes (Signed)
 [facility]    CSN: [id number] Arrival date & time: 07/12/23  1730      History   Chief Complaint Chief Complaint  Patient presents with   Cough   Asthma    HPI [name] is a 38 y.o. female who presents with asthma exacerbation which she believes is from vapping. She started with a mild cold 3 days ago and got worse today. Has not had a fever or GI symptoms. She denies fever or myalgias.     Past Medical History:  Diagnosis Date   Asthma    Chronic abdominal pain    Cocaine abuse (HCC)    Depression    Drug-seeking behavior    Endometriosis    GERD (gastroesophageal reflux disease)    Homelessness    Hypertension    Polysubstance abuse [facility])     Patient Active Problem List   Diagnosis Date Noted   GI bleed 12/09/2020   Intractable nausea and vomiting 12/08/2020   Asthma 12/08/2020   Drug-seeking behavior 01/21/2014   DEPRESSION 05/05/2009   HYPERTENSION 05/05/2009   GERD 05/05/2009   ABDOMINAL PAIN, CHRONIC 05/05/2009    Past Surgical History:  Procedure Laterality Date   BIOPSY  12/09/2020   Procedure: BIOPSY;  Surgeon: Elicia Claw, MD;  Location: [facility];  Service: Gastroenterology;;   ESOPHAGOGASTRODUODENOSCOPY N/A 12/09/2020   Procedure: ESOPHAGOGASTRODUODENOSCOPY (EGD);  Surgeon: Elicia Claw, MD;  Location: [facility];  Service: Gastroenterology;  Laterality: N/A;   PARTIAL HYSTERECTOMY  2009   left her ovaries   TONSILLECTOMY      OB History     Gravida  2   Para  2   Term  2   Preterm  0   AB  0   Living  2      SAB  0   IAB  0   Ectopic  0   Multiple  0   Live Births  2            Home Medications    Prior to Admission medications   Medication Sig Start Date End Date Taking? Authorizing Provider  albuterol  (PROVENTIL ) (2.5 MG/3ML) 0.083% nebulizer solution Take 3 mLs (2.5 mg total) by nebulization every 6 (six) hours as needed for wheezing or shortness of breath. 07/12/23  Yes  Rodriguez-Southworth, Kenyonna Micek, PA-C  ondansetron  (ZOFRAN ) 4 MG tablet Take 1 tablet (4 mg total) by mouth every 6 (six) hours. 12/08/20   Emelia Sluder, PA-C  pantoprazole  (PROTONIX ) 40 MG tablet Take 1 tablet (40 mg total) by mouth 2 (two) times daily before a meal. Protonix  40mg  BID for 2 months, then once daily as per GI recommendations 12/10/20 01/09/21  Cheryle Page, MD    Family History Family History  Problem Relation Age of Onset   Seizures Mother    Cancer Mother    Hyperlipidemia Mother    Hypertension Mother    Hypertension Father    Hyperlipidemia Father     Social History Social History   Tobacco Use   Smoking status: Every Day    Current packs/day: 0.50    Types: Cigarettes   Smokeless tobacco: Never  Vaping Use   Vaping status: Never Used  Substance Use Topics   Alcohol use: No   Drug use: Yes    Types: Marijuana    Comment: Narcotics     Allergies   Contrast media [iodinated contrast media], Gabapentin , Reglan [metoclopramide], and Latex   Review of Systems Review of Systems As noted  in HPI  Physical Exam Triage Vital Signs ED Triage Vitals [07/12/23 1908]  Encounter Vitals Group     BP 118/73     Systolic BP Percentile      Diastolic BP Percentile      Pulse Rate (!) 103     Resp 20     Temp 99.7 F (37.6 C)     Temp Source Oral     SpO2 97 %     Weight      Height      Head Circumference      Peak Flow      Pain Score 0     Pain Loc      Pain Education      Exclude from Growth Chart    No data found.  Updated Vital Signs BP 118/73 (BP Location: Right Arm)   Pulse (!) 103   Temp 99.7 F (37.6 C) (Oral)   Resp 20   SpO2 97%   Visual Acuity Right Eye Distance:   Left Eye Distance:   Bilateral Distance:    Right Eye Near:   Left Eye Near:    Bilateral Near:     Physical Exam Physical Exam Constitutional:      General: He is not in acute distress.    Appearance: He is not toxic-appearing.  HENT:     Head: Normocephalic.      Right Ear: Tympanic membrane, ear canal and external ear normal.     Left Ear: Ear canal and external ear normal.     Nose: Nose normal.     Mouth/Throat:     Mouth: Mucous membranes are moist.     Pharynx: Oropharynx is clear.  Eyes:     General: No scleral icterus.    Conjunctiva/sclera: Conjunctivae normal.  Cardiovascular:     Rate and Rhythm: Normal rate and regular rhythm.     Heart sounds: No murmur heard.   Pulmonary:     Effort: Pulmonary effort is normal. No respiratory distress.     Breath sounds: Wheezing present.     Comments: Has auditory wheezing Musculoskeletal:        General: Normal range of motion.     Cervical back: Neck supple.  Lymphadenopathy:     Cervical: No cervical adenopathy.  Skin:    General: Skin is warm and dry.     Findings: No rash.  Neurological:     Mental Status: He is alert and oriented to person, place, and time.     Gait: Gait normal.  Psychiatric:        Mood and Affect: Mood normal.        Behavior: Behavior normal.        Thought Content: Thought content normal.        Judgment: Judgment normal.     UC Treatments / Results  Labs (all labs ordered are listed, but only abnormal results are displayed) Labs Reviewed - No data to display  EKG   Radiology No results found.  Procedures Procedures (including critical care time)  Medications Ordered in UC Medications  albuterol  (VENTOLIN  HFA) 108 (90 Base) MCG/ACT inhaler 2 puff (2 puffs Inhalation Given 07/12/23 2008)  ipratropium-albuterol  (DUONEB) 0.5-2.5 (3) MG/3ML nebulizer solution 3 mL (3 mLs Nebulization Given 07/12/23 1957)  methylPREDNISolone  sodium succinate (SOLU-MEDROL ) 125 mg/2 mL injection 60 mg (60 mg Intramuscular Given 07/12/23 2007)    Initial Impression / Assessment and Plan / UC Course  I have reviewed the triage  vital signs and the nursing notes.  She was given Duo neb treatment and Solumedrol 60 mg IM here. Her lung exam was much improved with meds  noted. She felt she was breathing much better.   Asthma axacerbation  She does not have money to purchase albuterol  inhaler so she was given one from here.  She was also given a neb machine and I sent medication for this machine.   Final Clinical Impressions(s) / UC Diagnoses   Final diagnoses:  Moderate persistent asthma with exacerbation   Discharge Instructions   None    ED Prescriptions     Medication Sig Dispense Auth. Provider   albuterol  (PROVENTIL ) (2.5 MG/3ML) 0.083% nebulizer solution Take 3 mLs (2.5 mg total) by nebulization every 6 (six) hours as needed for wheezing or shortness of breath. 75 mL Rodriguez-Southworth, Kyra, PA-C      PDMP not reviewed this encounter.   Lindi Kyra, PA-C 07/12/23 2018

## 2023-09-01 ENCOUNTER — Emergency Department (HOSPITAL_COMMUNITY)
Admission: EM | Admit: 2023-09-01 | Discharge: 2023-09-02 | Disposition: A | Discharge status: Home / Self Care | Payer: Medicaid Other | Attending: Emergency Medicine | Admitting: Emergency Medicine

## 2023-09-01 DIAGNOSIS — Z91040 Latex allergy status: Secondary | ICD-10-CM | POA: Diagnosis not present

## 2023-09-01 DIAGNOSIS — R000 Tachycardia, unspecified: Secondary | ICD-10-CM | POA: Diagnosis not present

## 2023-09-01 DIAGNOSIS — T50901A Poisoning by unspecified drugs, medicaments and biological substances, accidental (unintentional), initial encounter: Secondary | ICD-10-CM | POA: Insufficient documentation

## 2023-09-01 DIAGNOSIS — R109 Unspecified abdominal pain: Secondary | ICD-10-CM | POA: Diagnosis not present

## 2023-09-01 DIAGNOSIS — R519 Headache, unspecified: Secondary | ICD-10-CM | POA: Insufficient documentation

## 2023-09-01 DIAGNOSIS — D72829 Elevated white blood cell count, unspecified: Secondary | ICD-10-CM | POA: Insufficient documentation

## 2023-09-01 DIAGNOSIS — T50904A Poisoning by unspecified drugs, medicaments and biological substances, undetermined, initial encounter: Secondary | ICD-10-CM | POA: Diagnosis not present

## 2023-09-01 DIAGNOSIS — R55 Syncope and collapse: Secondary | ICD-10-CM | POA: Diagnosis not present

## 2023-09-01 DIAGNOSIS — T887XXA Unspecified adverse effect of drug or medicament, initial encounter: Secondary | ICD-10-CM | POA: Diagnosis not present

## 2023-09-01 DIAGNOSIS — R739 Hyperglycemia, unspecified: Secondary | ICD-10-CM | POA: Diagnosis not present

## 2023-09-01 DIAGNOSIS — I10 Essential (primary) hypertension: Secondary | ICD-10-CM | POA: Diagnosis not present

## 2023-09-01 MED ORDER — ONDANSETRON HCL 4 MG/2ML IJ SOLN
4.0000 mg | Freq: Once | INTRAMUSCULAR | Status: AC
  Administered 2023-09-02: 4 mg via INTRAVENOUS
  Filled 2023-09-01: qty 2

## 2023-09-01 MED ORDER — LACTATED RINGERS IV BOLUS
1000.0000 mL | Freq: Once | INTRAVENOUS | Status: AC
  Administered 2023-09-02: 1000 mL via INTRAVENOUS

## 2023-09-01 NOTE — ED Triage Notes (Signed)
 BIB EMS after being found in truck unresponsive. Patient reports snorting "a tan powdery substance, might have been heroine". Denies SI/ HI. Given 2mg  IN Narcan on scene with minimal changes and then another 2 mg Narcan IV en route. Patient now A&O X 4.

## 2023-09-02 ENCOUNTER — Emergency Department (HOSPITAL_COMMUNITY): Payer: Medicaid Other

## 2023-09-02 ENCOUNTER — Encounter (HOSPITAL_COMMUNITY): Payer: Self-pay | Admitting: Emergency Medicine

## 2023-09-02 ENCOUNTER — Other Ambulatory Visit: Payer: Self-pay

## 2023-09-02 DIAGNOSIS — T50901A Poisoning by unspecified drugs, medicaments and biological substances, accidental (unintentional), initial encounter: Secondary | ICD-10-CM | POA: Diagnosis not present

## 2023-09-02 LAB — URINALYSIS, ROUTINE W REFLEX MICROSCOPIC
Bilirubin Urine: NEGATIVE
Glucose, UA: 50 mg/dL — AB
Hgb urine dipstick: NEGATIVE
Ketones, ur: NEGATIVE mg/dL
Leukocytes,Ua: NEGATIVE
Nitrite: NEGATIVE
Protein, ur: 100 mg/dL — AB
Specific Gravity, Urine: 1.018 (ref 1.005–1.030)
pH: 6 (ref 5.0–8.0)

## 2023-09-02 LAB — COMPREHENSIVE METABOLIC PANEL
ALT: 28 U/L (ref 0–44)
AST: 42 U/L — ABNORMAL HIGH (ref 15–41)
Albumin: 3.2 g/dL — ABNORMAL LOW (ref 3.5–5.0)
Alkaline Phosphatase: 58 U/L (ref 38–126)
Anion gap: 11 (ref 5–15)
BUN: 15 mg/dL (ref 6–20)
CO2: 22 mmol/L (ref 22–32)
Calcium: 8.4 mg/dL — ABNORMAL LOW (ref 8.9–10.3)
Chloride: 107 mmol/L (ref 98–111)
Creatinine, Ser: 0.7 mg/dL (ref 0.44–1.00)
GFR, Estimated: >60 mL/min (ref >60)
Glucose, Bld: 66 mg/dL — ABNORMAL LOW (ref 70–99)
Potassium: 3.5 mmol/L (ref 3.5–5.1)
Sodium: 140 mmol/L (ref 135–145)
Total Bilirubin: 0.2 mg/dL (ref 0.0–1.2)
Total Protein: 5.7 g/dL — ABNORMAL LOW (ref 6.5–8.1)

## 2023-09-02 LAB — CBC WITH DIFFERENTIAL/PLATELET
Abs Immature Granulocytes: 0.1 10*3/uL — ABNORMAL HIGH (ref 0.00–0.07)
Basophils Absolute: 0.1 10*3/uL (ref 0.0–0.1)
Basophils Relative: 0 %
Eosinophils Absolute: 0.1 10*3/uL (ref 0.0–0.5)
Eosinophils Relative: 1 %
HCT: 38.5 % (ref 36.0–46.0)
Hemoglobin: 12.4 g/dL (ref 12.0–15.0)
Immature Granulocytes: 1 %
Lymphocytes Relative: 10 %
Lymphs Abs: 1.8 10*3/uL (ref 0.7–4.0)
MCH: 29.5 pg (ref 26.0–34.0)
MCHC: 32.2 g/dL (ref 30.0–36.0)
MCV: 91.4 fL (ref 80.0–100.0)
Monocytes Absolute: 1 10*3/uL (ref 0.1–1.0)
Monocytes Relative: 5 %
Neutro Abs: 15.4 10*3/uL — ABNORMAL HIGH (ref 1.7–7.7)
Neutrophils Relative %: 83 %
Platelets: 204 10*3/uL (ref 150–400)
RBC: 4.21 MIL/uL (ref 3.87–5.11)
RDW: 12 % (ref 11.5–15.5)
WBC: 18.5 10*3/uL — ABNORMAL HIGH (ref 4.0–10.5)
nRBC: 0 % (ref 0.0–0.2)

## 2023-09-02 LAB — RAPID URINE DRUG SCREEN, HOSP PERFORMED
Amphetamines: POSITIVE — AB
Barbiturates: NOT DETECTED
Benzodiazepines: NOT DETECTED
Cocaine: POSITIVE — AB
Opiates: POSITIVE — AB
Tetrahydrocannabinol: POSITIVE — AB

## 2023-09-02 LAB — HCG, SERUM, QUALITATIVE: Preg, Serum: NEGATIVE

## 2023-09-02 LAB — ACETAMINOPHEN LEVEL: Acetaminophen (Tylenol), Serum: <10 ug/mL — ABNORMAL LOW (ref 10–30)

## 2023-09-02 LAB — SALICYLATE LEVEL: Salicylate Lvl: <7 mg/dL — ABNORMAL LOW (ref 7.0–30.0)

## 2023-09-02 LAB — TROPONIN I (HIGH SENSITIVITY): Troponin I (High Sensitivity): 3 ng/L (ref <18)

## 2023-09-02 LAB — ETHANOL: Alcohol, Ethyl (B): <10 mg/dL (ref <10)

## 2023-09-02 LAB — CBG MONITORING, ED: Glucose-Capillary: 74 mg/dL (ref 70–99)

## 2023-09-02 MED ORDER — NALOXONE HCL 4 MG/0.1ML NA LIQD: NASAL | 0 refills | Status: AC

## 2023-09-02 NOTE — ED Provider Notes (Signed)
 [place] EMERGENCY DEPARTMENT AT [facility] Provider Note   CSN: [id number] Arrival date & time: 09/01/23  2350     History  Chief Complaint  Patient presents with   Drug Overdose    [name] is a 38 y.o. female.  History Obtained from EMS.  Patient with a history of endometriosis, chronic pain, polysubstance abuse presents with concern for overdose.  She states her restless leg pain was especially bad today and she was uncomfortable and "relapsed".  She was clean for about a month prior to this.  She started a white powdery substance that she was told was heroin.  Unclear who called EMS but she was found unresponsive and apneic.  On EMS arrival she was in respiratory arrest and received 2 mg IM Narcan and another 2 mg IV Narcan and is now awake and alert.   she feels her shortness of breath is at baseline from her asthma.  Denies chest pain.  She has chronic abdominal pain which is unchanged.  No fever. Denies any IV drug abuse.  She denies any thoughts of self-harm or harming anyone else. Has a headache and dizziness. Did hit her head yesterday. No LOC. No vomiting until after narcan today.  The history is provided by the patient and the EMS personnel. The history is limited by the condition of the patient.  Drug Overdose Associated symptoms include abdominal pain and headaches. Pertinent negatives include no chest pain.       Home Medications Prior to Admission medications   Medication Sig Start Date End Date Taking? Authorizing Provider  albuterol (PROVENTIL) (2.5 MG/3ML) 0.083% nebulizer solution Take 3 mLs (2.5 mg total) by nebulization every 6 (six) hours as needed for wheezing or shortness of breath. 07/12/23   Rodriguez-Southworth, Nettie Elm, PA-C  ondansetron (ZOFRAN) 4 MG tablet Take 1 tablet (4 mg total) by mouth every 6 (six) hours. 12/08/20   Theron Arista, PA-C  pantoprazole (PROTONIX) 40 MG tablet Take 1 tablet (40 mg total) by mouth 2 (two) times daily  before a meal. Protonix 40mg  BID for 2 months, then once daily as per GI recommendations 12/10/20 01/09/21  Glade Lloyd, MD      Allergies    Contrast media [iodinated contrast media], Gabapentin, Reglan [metoclopramide], and Latex    Review of Systems   Review of Systems  Constitutional:  Negative for activity change, appetite change and fever.  HENT:  Negative for congestion.   Respiratory:  Negative for chest tightness.   Cardiovascular:  Negative for chest pain.  Gastrointestinal:  Positive for abdominal pain, nausea and vomiting.  Genitourinary:  Negative for dysuria and hematuria.  Musculoskeletal:  Positive for arthralgias and myalgias.  Neurological:  Positive for headaches.   all other systems are negative except as noted in the HPI and PMH.    Physical Exam Updated Vital Signs BP (!) 123/93 (BP Location: Right Arm)   Pulse 89   Temp 97.8 F (36.6 C) (Oral)   Resp 20   SpO2 100%  Physical Exam Vitals and nursing note reviewed.  Constitutional:      General: She is not in acute distress.    Appearance: She is well-developed.     Comments: Awake and alert Dry heaving  HENT:     Head: Normocephalic and atraumatic.     Mouth/Throat:     Pharynx: No oropharyngeal exudate.  Eyes:     Conjunctiva/sclera: Conjunctivae normal.     Pupils: Pupils are equal, round, and reactive to  light.  Neck:     Comments: No meningismus. Cardiovascular:     Rate and Rhythm: Normal rate and regular rhythm.     Heart sounds: Normal heart sounds. No murmur heard. Pulmonary:     Effort: Pulmonary effort is normal. No respiratory distress.     Breath sounds: Normal breath sounds.  Abdominal:     Palpations: Abdomen is soft.     Tenderness: There is no abdominal tenderness. There is no guarding or rebound.  Musculoskeletal:        General: No tenderness. Normal range of motion.     Cervical back: Normal range of motion and neck supple.  Skin:    General: Skin is warm.  Neurological:      Mental Status: She is alert and oriented to person, place, and time.     Cranial Nerves: No cranial nerve deficit.     Motor: No abnormal muscle tone.     Coordination: Coordination normal.     Comments:  5/5 strength throughout. CN 2-12 intact.Equal grip strength.   Psychiatric:        Behavior: Behavior normal.     ED Results / Procedures / Treatments   Labs (all labs ordered are listed, but only abnormal results are displayed) Labs Reviewed  CBC WITH DIFFERENTIAL/PLATELET - Abnormal; Notable for the following components:      Result Value   WBC 18.5 (*)    Neutro Abs 15.4 (*)    Abs Immature Granulocytes 0.10 (*)    All other components within normal limits  COMPREHENSIVE METABOLIC PANEL - Abnormal; Notable for the following components:   Glucose, Bld 66 (*)    Calcium 8.4 (*)    Total Protein 5.7 (*)    Albumin 3.2 (*)    AST 42 (*)    All other components within normal limits  ACETAMINOPHEN LEVEL - Abnormal; Notable for the following components:   Acetaminophen (Tylenol), Serum <10 (*)    All other components within normal limits  SALICYLATE LEVEL - Abnormal; Notable for the following components:   Salicylate Lvl <7.0 (*)    All other components within normal limits  URINALYSIS, ROUTINE W REFLEX MICROSCOPIC - Abnormal; Notable for the following components:   APPearance CLOUDY (*)    Glucose, UA 50 (*)    Protein, ur 100 (*)    Bacteria, UA RARE (*)    All other components within normal limits  RAPID URINE DRUG SCREEN, HOSP PERFORMED - Abnormal; Notable for the following components:   Opiates POSITIVE (*)    Cocaine POSITIVE (*)    Amphetamines POSITIVE (*)    Tetrahydrocannabinol POSITIVE (*)    All other components within normal limits  ETHANOL  HCG, SERUM, QUALITATIVE  CBG MONITORING, ED  TROPONIN I (HIGH SENSITIVITY)  TROPONIN I (HIGH SENSITIVITY)    EKG EKG Interpretation Date/Time:  Sunday September 02 2023 00:28:19 EST Ventricular Rate:  79 PR  Interval:  145 QRS Duration:  97 QT Interval:  409 QTC Calculation: 469 R Axis:   106  Text Interpretation: Age not entered, assumed to be  38 years old for purpose of ECG interpretation Sinus rhythm RAE, consider biatrial enlargement Right axis deviation No significant change was found Confirmed by Glynn Octave [phone]) on 09/02/2023 12:38:40 AM  Radiology CT Head Wo Contrast Result Date: 09/02/2023 CLINICAL DATA:  Drug overdose EXAM: CT HEAD WITHOUT CONTRAST TECHNIQUE: Contiguous axial images were obtained from the base of the skull through the vertex without intravenous contrast. RADIATION  DOSE REDUCTION: This exam was performed according to the departmental dose-optimization program which includes automated exposure control, adjustment of the mA and/or kV according to patient size and/or use of iterative reconstruction technique. COMPARISON:  None Available. FINDINGS: Brain: No evidence of acute infarction, hemorrhage, hydrocephalus, extra-axial collection or mass lesion/mass effect. Vascular: No hyperdense vessel or unexpected calcification. Skull: Normal. Negative for fracture or focal lesion. Sinuses/Orbits: The visualized paranasal sinuses are essentially clear. The mastoid air cells are unopacified. Other: None. IMPRESSION: Normal head CT. Electronically Signed   By: Charline Bills M.D.   On: 09/02/2023 01:19   DG Chest Portable 1 View Result Date: 09/02/2023 CLINICAL DATA:  Overdose EXAM: PORTABLE CHEST 1 VIEW COMPARISON:  Chest x-ray 04/04/2023 FINDINGS: The heart size and mediastinal contours are within normal limits. Both lungs are clear. The visualized skeletal structures are unremarkable. IMPRESSION: No active disease. Electronically Signed   By: Darliss Cheney M.D.   On: 09/02/2023 00:31    Procedures Procedures    Medications Ordered in ED Medications  lactated ringers bolus 1,000 mL (has no administration in time range)  ondansetron (ZOFRAN) injection 4 mg (has no  administration in time range)    ED Course/ Medical Decision Making/ A&P                                 Medical Decision Making Amount and/or Complexity of Data Reviewed Independent Historian: EMS Labs: ordered. Decision-making details documented in ED Course. Radiology: ordered and independent interpretation performed. Decision-making details documented in ED Course. ECG/medicine tests: ordered and independent interpretation performed. Decision-making details documented in ED Course.  Risk Prescription drug management.   Apparent overdose of opiates. Had respiratory arrest at home. Now awake after narcan. Denies SI.   Vital stable on arrival.  Patient is awake and alert.  She has chronic pain in her legs and abdomen which is unchanged.  Denies chest pain or new shortness of breath.  Will observe for several hours in the ED after she received Narcan.  Also obtain CT head given report of head trauma yesterday with vomiting.  CT head is negative for hemorrhage or other traumatic injury.  Chest x-ray is clear.  No hypoxia or increased work of breathing on room air.  Labs reassuring.  Ethanol, acetaminophen and salicylate levels are negative.  Leukocytosis of 18 likely reactive.  Mild hypoglycemia of 66.  Will offer p.o. food  BLood sugar improved to 74.   Tolerating PO and ambulatory.  Patient observed in the ED for 3 hours after narcan. Has not needed further narcan throughout ED course.   UDS positive for cocaine, opiates, amphetamines, THC.  Discussed with patient to cease heroin and other illicit drug use.  She denies suicidal thoughts or homicidal thoughts.  She feels back to baseline, tolerating p.o. and ambulatory. Return precautions discussed. Home narcan prescription provided.        Final Clinical Impression(s) / ED Diagnoses Final diagnoses:  Accidental overdose, initial encounter    Rx / DC Orders ED Discharge Orders     None         Taiquan Campanaro,  Jeannett Senior, MD 09/02/23 [phone]

## 2023-09-02 NOTE — ED Notes (Signed)
 Patient ambulated around ER with steady gait. Patient tolerating fluids and sandwich without nausea or vomiting.

## 2023-09-02 NOTE — ED Provider Notes (Incomplete)
  [place] EMERGENCY DEPARTMENT AT [facility] Provider Note   CSN: [id number] Arrival date & time: 09/01/23  2350     History {Add pertinent medical, surgical, social history, OB history to HPI:1} Chief Complaint  Patient presents with  . Drug Overdose    [name] is a 38 y.o. female.  HPI     Home Medications Prior to Admission medications   Medication Sig Start Date End Date Taking? Authorizing Provider  albuterol (PROVENTIL) (2.5 MG/3ML) 0.083% nebulizer solution Take 3 mLs (2.5 mg total) by nebulization every 6 (six) hours as needed for wheezing or shortness of breath. 07/12/23   Rodriguez-Southworth, Nettie Elm, PA-C  ondansetron (ZOFRAN) 4 MG tablet Take 1 tablet (4 mg total) by mouth every 6 (six) hours. 12/08/20   Theron Arista, PA-C  pantoprazole (PROTONIX) 40 MG tablet Take 1 tablet (40 mg total) by mouth 2 (two) times daily before a meal. Protonix 40mg  BID for 2 months, then once daily as per GI recommendations 12/10/20 01/09/21  Glade Lloyd, MD      Allergies    Contrast media [iodinated contrast media], Gabapentin, Reglan [metoclopramide], and Latex    Review of Systems   Review of Systems  Physical Exam Updated Vital Signs There were no vitals taken for this visit. Physical Exam  ED Results / Procedures / Treatments   Labs (all labs ordered are listed, but only abnormal results are displayed) Labs Reviewed  CBC WITH DIFFERENTIAL/PLATELET  COMPREHENSIVE METABOLIC PANEL  ACETAMINOPHEN LEVEL  SALICYLATE LEVEL  ETHANOL  HCG, SERUM, QUALITATIVE  URINALYSIS, ROUTINE W REFLEX MICROSCOPIC  RAPID URINE DRUG SCREEN, HOSP PERFORMED  TROPONIN I (HIGH SENSITIVITY)    EKG None  Radiology No results found.  Procedures Procedures  {Document cardiac monitor, telemetry assessment procedure when appropriate:1}  Medications Ordered in ED Medications  lactated ringers bolus 1,000 mL (has no administration in time range)  ondansetron (ZOFRAN)  injection 4 mg (has no administration in time range)    ED Course/ Medical Decision Making/ A&P   {   Click here for ABCD2, HEART and other calculatorsREFRESH Note before signing :1}                              Medical Decision Making Amount and/or Complexity of Data Reviewed Labs: ordered. Radiology: ordered. ECG/medicine tests: ordered.  Risk Prescription drug management.   ***  {Document critical care time when appropriate:1} {Document review of labs and clinical decision tools ie heart score, Chads2Vasc2 etc:1}  {Document your independent review of radiology images, and any outside records:1} {Document your discussion with family members, caretakers, and with consultants:1} {Document social determinants of health affecting pt's care:1} {Document your decision making why or why not admission, treatments were needed:1} Final Clinical Impression(s) / ED Diagnoses Final diagnoses:  None    Rx / DC Orders ED Discharge Orders     None

## 2023-09-02 NOTE — Discharge Instructions (Addendum)
 Stop using heroin and other illicit drugs.  Follow-up with your doctor, return to the ED with new or worsening symptoms

## 2023-09-02 NOTE — ED Notes (Signed)
 Patient discharged home. VSS. Patient ambulatory out of treatment area with clean and steady gait. All questions answered at time of discharge. Belongings sent home with patient.

## 2023-09-02 NOTE — ED Notes (Signed)
 Pt unable to void at this time. Pt given sprite, clear by nurse.  KM

## 2024-06-14 ENCOUNTER — Other Ambulatory Visit: Payer: Self-pay

## 2024-06-14 ENCOUNTER — Encounter (HOSPITAL_COMMUNITY): Payer: Self-pay

## 2024-06-14 ENCOUNTER — Emergency Department (HOSPITAL_COMMUNITY)
Admission: EM | Admit: 2024-06-14 | Discharge: 2024-06-14 | Disposition: A | Discharge status: Home / Self Care | Attending: Emergency Medicine | Admitting: Emergency Medicine

## 2024-06-14 ENCOUNTER — Emergency Department (HOSPITAL_COMMUNITY)

## 2024-06-14 DIAGNOSIS — M545 Low back pain, unspecified: Secondary | ICD-10-CM | POA: Diagnosis not present

## 2024-06-14 DIAGNOSIS — M4726 Other spondylosis with radiculopathy, lumbar region: Secondary | ICD-10-CM | POA: Diagnosis not present

## 2024-06-14 DIAGNOSIS — M5431 Sciatica, right side: Secondary | ICD-10-CM

## 2024-06-14 DIAGNOSIS — M5441 Lumbago with sciatica, right side: Secondary | ICD-10-CM | POA: Diagnosis not present

## 2024-06-14 DIAGNOSIS — M4319 Spondylolisthesis, multiple sites in spine: Secondary | ICD-10-CM | POA: Diagnosis not present

## 2024-06-14 DIAGNOSIS — M4727 Other spondylosis with radiculopathy, lumbosacral region: Secondary | ICD-10-CM | POA: Diagnosis not present

## 2024-06-14 MED ORDER — LIDOCAINE 5 % EX PTCH
1.0000 | MEDICATED_PATCH | CUTANEOUS | Status: DC
  Administered 2024-06-14: 1 via TRANSDERMAL
  Filled 2024-06-14: qty 1

## 2024-06-14 MED ORDER — IBUPROFEN 600 MG PO TABS: 600.0000 mg | ORAL_TABLET | Freq: Four times a day (QID) | ORAL | 0 refills | Status: AC | PRN

## 2024-06-14 MED ORDER — KETOROLAC TROMETHAMINE 15 MG/ML IJ SOLN
15.0000 mg | Freq: Once | INTRAMUSCULAR | Status: AC
  Administered 2024-06-14: 15 mg via INTRAMUSCULAR
  Filled 2024-06-14: qty 1

## 2024-06-14 MED ORDER — PREDNISONE 20 MG PO TABS
60.0000 mg | ORAL_TABLET | Freq: Once | ORAL | Status: AC
  Administered 2024-06-14: 60 mg via ORAL
  Filled 2024-06-14: qty 3

## 2024-06-14 MED ORDER — CYCLOBENZAPRINE HCL 10 MG PO TABS: 10.0000 mg | ORAL_TABLET | Freq: Two times a day (BID) | ORAL | 0 refills | Status: AC | PRN

## 2024-06-14 MED ORDER — PREDNISONE 20 MG PO TABS: ORAL_TABLET | ORAL | 0 refills | Status: AC

## 2024-06-14 MED ORDER — CYCLOBENZAPRINE HCL 10 MG PO TABS
5.0000 mg | ORAL_TABLET | Freq: Once | ORAL | Status: AC
  Administered 2024-06-14: 5 mg via ORAL
  Filled 2024-06-14: qty 1

## 2024-06-14 NOTE — ED Provider Notes (Signed)
 [place] EMERGENCY DEPARTMENT AT [facility] Provider Note   CSN: [id number] Arrival date & time: 06/14/24  1636     Patient presents with: Back Pain   [name] is a 38 y.o. female.   The history is provided by the patient and medical records. No language interpreter was used.  Back Pain Associated symptoms: no fever      38 year old female known history of polysubstance use, drug-seeking behavior, hypertension, GERD, endometriosis brought here via EMS with complaint of back pain patient states she is having severe pain from her lower back that radiates down to her both legs right greater than left since last night after she was moving some heavy object.  Pain has been persistent, worse with any kind of movement.  No associate numbness or weakness no bowel bladder incontinence no saddle anesthesia no fever or chills no history of IV drug use or active cancer.  No specific treatment tried prior to arrival.  Prior to Admission medications   Medication Sig Start Date End Date Taking? Authorizing Provider  albuterol  (PROVENTIL ) (2.5 MG/3ML) 0.083% nebulizer solution Take 3 mLs (2.5 mg total) by nebulization every 6 (six) hours as needed for wheezing or shortness of breath. 07/12/23   Rodriguez-Southworth, Sylvia, PA-C  naloxone  (NARCAN ) nasal spray 4 mg/0.1 mL For accidental overdose 09/02/23   Rancour, Garnette, MD  ondansetron  (ZOFRAN ) 4 MG tablet Take 1 tablet (4 mg total) by mouth every 6 (six) hours. 12/08/20   Emelia Sluder, PA-C  pantoprazole  (PROTONIX ) 40 MG tablet Take 1 tablet (40 mg total) by mouth 2 (two) times daily before a meal. Protonix  40mg  BID for 2 months, then once daily as per GI recommendations 12/10/20 01/09/21  Cheryle Page, MD    Allergies: Contrast media [iodinated contrast media], Gabapentin , Reglan [metoclopramide], and Latex    Review of Systems  Constitutional:  Negative for fever.  Musculoskeletal:  Positive for back pain.    Updated Vital  Signs BP (!) 126/99   Pulse (!) 106   Temp 98.1 F (36.7 C) (Oral)   Resp 18   Ht 5' 8 (1.727 m)   Wt 72.6 kg   SpO2 98%   BMI 24.33 kg/m   Physical Exam Vitals and nursing note reviewed.  Constitutional:      Appearance: She is well-developed.     Comments: Patient is laying in a prone position, crying, appears uncomfortable.  HENT:     Head: Atraumatic.  Eyes:     Conjunctiva/sclera: Conjunctivae normal.  Pulmonary:     Effort: Pulmonary effort is normal.  Abdominal:     Palpations: Abdomen is soft.     Tenderness: There is no abdominal tenderness.  Musculoskeletal:        General: Tenderness (Tenderness along lumbar and right lumbosacral region on palpation with positive straight leg raise.  Intact dorsalis pedis pulse with brisk cap refill and no foot drop.) present.     Cervical back: Neck supple.  Skin:    Capillary Refill: Capillary refill takes less than 2 seconds.     Findings: No rash.  Neurological:     Mental Status: She is alert.  Psychiatric:        Mood and Affect: Mood normal.     (all labs ordered are listed, but only abnormal results are displayed) Labs Reviewed  URINALYSIS, ROUTINE W REFLEX MICROSCOPIC    EKG: None  Radiology: DG Lumbar Spine Complete Result Date: 06/14/2024 CLINICAL DATA:  Lower back pain and bilateral lower  extremity radiculopathy EXAM: LUMBAR SPINE - COMPLETE 4+ VIEW COMPARISON:  12/08/2020 FINDINGS: Frontal, bilateral oblique, and lateral views of the lumbar spine are obtained on 5 images. There are 5 non-rib-bearing lumbar type vertebral bodies identified. 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 No acute fracture. There is progressive moderate spondylosis at the L5-S1 level. Remaining disc spaces are well preserved. Mild facet hypertrophic changes from L3-4 through L5-S1. Sacroiliac joints are unremarkable. IMPRESSION: 1. No acute lumbar spine fracture. 2. 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 3. Progressive lower lumbar degenerative changes greatest at the L5-S1 level. Electronically Signed   By: Ozell Daring M.D.   On: 06/14/2024 18:13     Procedures   Medications Ordered in the ED  lidocaine  (LIDODERM ) 5 % 1 patch (1 patch Transdermal Patch Applied 06/14/24 2039)  cyclobenzaprine  (FLEXERIL ) tablet 5 mg (has no administration in time range)  predniSONE  (DELTASONE ) tablet 60 mg (has no administration in time range)  ketorolac  (TORADOL ) 15 MG/ML injection 15 mg (15 mg Intramuscular Given 06/14/24 1839)                                    Medical Decision Making Amount and/or Complexity of Data Reviewed Labs: ordered. Radiology: ordered.  Risk Prescription drug management.   BP (!) 127/93   Pulse 94   Temp 98.1 F (36.7 C) (Oral)   Resp 19   Ht 5' 8 (1.727 m)   Wt 72.6 kg   SpO2 95%   BMI 24.33 kg/m   62:35 PM  38 year old female known history of polysubstance use, drug-seeking behavior, hypertension, GERD, endometriosis brought here via EMS with complaint of back pain patient states she is having severe pain from her lower back that radiates down to her both legs right greater than left since last night after she was moving some heavy object.  Pain has been persistent, worse with any kind of movement.  No associate numbness or weakness no bowel bladder incontinence no saddle anesthesia no fever or chills no history of IV drug use or active cancer.  No specific treatment tried prior to arrival.  Exam notable for reproducible tenderness along lumbar and right lumbosacral region on palpation with positive straight leg raise.  Patient is neurovascular intact.  L-spine x-ray obtained independently reviewed inter by me without any acute finding.  Patient does have evidence of spondylolysis with grade 1 anterolisthesis along with lower lumbar degenerative change which may contribute to her symptoms.  No red flags, doubt cauda  equina, discitis, or epidural abscess.  Will prescribe muscle relaxant, along with the course of steroid for symptom control.  Will treat for sciatica.  Patient able to ambulate.  Take medication     Final diagnoses:  Sciatica, right side    ED Discharge Orders          Ordered    predniSONE  (DELTASONE ) 20 MG tablet        06/14/24 2309    cyclobenzaprine  (FLEXERIL ) 10 MG tablet  2 times daily PRN        06/14/24 2309    ibuprofen  (ADVIL ) 600 MG tablet  Every 6 hours PRN        06/14/24 2309               Nivia Colon, PA-C 06/14/24 2310

## 2024-06-14 NOTE — ED Notes (Signed)
 Pt unable to give urine specimen at this time

## 2024-06-14 NOTE — Discharge Instructions (Addendum)
 Take medications Prescribed as treatment of your back pain related to sciatica.  Follow-up with Ortho specialist for outpatient care.

## 2024-06-14 NOTE — ED Triage Notes (Signed)
 Per EMS:  Back Pain Moving things last night Pain radiates down both legs Took suboxone  No relief

## 2024-07-09 ENCOUNTER — Emergency Department (HOSPITAL_BASED_OUTPATIENT_CLINIC_OR_DEPARTMENT_OTHER)
Admission: EM | Admit: 2024-07-09 | Discharge: 2024-07-09 | Disposition: A | Discharge status: Home / Self Care | Source: Home / Self Care | Attending: Emergency Medicine | Admitting: Emergency Medicine

## 2024-07-09 ENCOUNTER — Emergency Department (HOSPITAL_BASED_OUTPATIENT_CLINIC_OR_DEPARTMENT_OTHER)

## 2024-07-09 ENCOUNTER — Encounter (HOSPITAL_BASED_OUTPATIENT_CLINIC_OR_DEPARTMENT_OTHER): Payer: Self-pay | Admitting: Emergency Medicine

## 2024-07-09 ENCOUNTER — Other Ambulatory Visit: Payer: Self-pay

## 2024-07-09 DIAGNOSIS — R109 Unspecified abdominal pain: Secondary | ICD-10-CM

## 2024-07-09 DIAGNOSIS — R1084 Generalized abdominal pain: Secondary | ICD-10-CM | POA: Diagnosis not present

## 2024-07-09 DIAGNOSIS — R197 Diarrhea, unspecified: Secondary | ICD-10-CM | POA: Insufficient documentation

## 2024-07-09 DIAGNOSIS — Z91040 Latex allergy status: Secondary | ICD-10-CM | POA: Insufficient documentation

## 2024-07-09 DIAGNOSIS — R112 Nausea with vomiting, unspecified: Secondary | ICD-10-CM | POA: Insufficient documentation

## 2024-07-09 DIAGNOSIS — I10 Essential (primary) hypertension: Secondary | ICD-10-CM | POA: Insufficient documentation

## 2024-07-09 LAB — CBC WITH DIFFERENTIAL/PLATELET
Abs Immature Granulocytes: 0.03 K/uL (ref 0.00–0.07)
Basophils Absolute: 0 K/uL (ref 0.0–0.1)
Basophils Relative: 0 %
Eosinophils Absolute: 0 K/uL (ref 0.0–0.5)
Eosinophils Relative: 0 %
HCT: 39.4 % (ref 36.0–46.0)
Hemoglobin: 13.2 g/dL (ref 12.0–15.0)
Immature Granulocytes: 0 %
Lymphocytes Relative: 26 %
Lymphs Abs: 2.4 K/uL (ref 0.7–4.0)
MCH: 28.8 pg (ref 26.0–34.0)
MCHC: 33.5 g/dL (ref 30.0–36.0)
MCV: 86 fL (ref 80.0–100.0)
Monocytes Absolute: 0.4 K/uL (ref 0.1–1.0)
Monocytes Relative: 5 %
Neutro Abs: 6.4 K/uL (ref 1.7–7.7)
Neutrophils Relative %: 69 %
Platelets: 375 K/uL (ref 150–400)
RBC: 4.58 MIL/uL (ref 3.87–5.11)
RDW: 12 % (ref 11.5–15.5)
WBC: 9.3 K/uL (ref 4.0–10.5)
nRBC: 0 % (ref 0.0–0.2)

## 2024-07-09 LAB — COMPREHENSIVE METABOLIC PANEL WITH GFR
ALT: 11 U/L (ref 0–44)
AST: 16 U/L (ref 15–41)
Albumin: 4.4 g/dL (ref 3.5–5.0)
Alkaline Phosphatase: 90 U/L (ref 38–126)
Anion gap: 13 (ref 5–15)
BUN: 17 mg/dL (ref 6–20)
CO2: 26 mmol/L (ref 22–32)
Calcium: 9.8 mg/dL (ref 8.9–10.3)
Chloride: 100 mmol/L (ref 98–111)
Creatinine, Ser: 0.62 mg/dL (ref 0.44–1.00)
GFR, Estimated: >60 mL/min
Glucose, Bld: 111 mg/dL — ABNORMAL HIGH (ref 70–99)
Potassium: 3.6 mmol/L (ref 3.5–5.1)
Sodium: 140 mmol/L (ref 135–145)
Total Bilirubin: 0.7 mg/dL (ref 0.0–1.2)
Total Protein: 8 g/dL (ref 6.5–8.1)

## 2024-07-09 LAB — HCG, SERUM, QUALITATIVE: Preg, Serum: NEGATIVE

## 2024-07-09 LAB — LIPASE, BLOOD: Lipase: 33 U/L (ref 11–51)

## 2024-07-09 MED ORDER — ONDANSETRON HCL 4 MG/2ML IJ SOLN
4.0000 mg | Freq: Once | INTRAMUSCULAR | Status: AC
  Administered 2024-07-09: 4 mg via INTRAVENOUS
  Filled 2024-07-09: qty 2

## 2024-07-09 MED ORDER — LORAZEPAM 2 MG/ML IJ SOLN
1.0000 mg | Freq: Once | INTRAMUSCULAR | Status: AC
  Administered 2024-07-09: 1 mg via INTRAVENOUS
  Filled 2024-07-09: qty 1

## 2024-07-09 MED ORDER — PROCHLORPERAZINE MALEATE 10 MG PO TABS
10.0000 mg | ORAL_TABLET | Freq: Once | ORAL | Status: AC
  Administered 2024-07-09: 10 mg via ORAL
  Filled 2024-07-09: qty 1

## 2024-07-09 MED ORDER — ONDANSETRON 8 MG PO TBDP: 8.0000 mg | ORAL_TABLET | Freq: Three times a day (TID) | ORAL | 0 refills | Status: AC | PRN

## 2024-07-09 MED ORDER — DICYCLOMINE HCL 20 MG PO TABS: 20.0000 mg | ORAL_TABLET | Freq: Two times a day (BID) | ORAL | 0 refills | Status: DC

## 2024-07-09 MED ORDER — KETOROLAC TROMETHAMINE 15 MG/ML IJ SOLN
15.0000 mg | Freq: Once | INTRAMUSCULAR | Status: AC
  Administered 2024-07-09: 15 mg via INTRAVENOUS
  Filled 2024-07-09: qty 1

## 2024-07-09 MED ORDER — LACTATED RINGERS IV BOLUS
1000.0000 mL | Freq: Once | INTRAVENOUS | Status: AC
  Administered 2024-07-09: 1000 mL via INTRAVENOUS

## 2024-07-09 MED ORDER — ONDANSETRON 8 MG PO TBDP: 8.0000 mg | ORAL_TABLET | Freq: Three times a day (TID) | ORAL | 0 refills | Status: DC | PRN

## 2024-07-09 MED ORDER — DICYCLOMINE HCL 20 MG PO TABS: 20.0000 mg | ORAL_TABLET | Freq: Two times a day (BID) | ORAL | 0 refills | Status: AC

## 2024-07-09 NOTE — ED Notes (Signed)
 Pt lying with eyes closed, resp even and unlabored.

## 2024-07-09 NOTE — ED Provider Notes (Signed)
 " [place] EMERGENCY DEPARTMENT AT [facility] Provider Note   CSN: [id number] Arrival date & time: 07/09/24  9250     Patient presents with: Abdominal Pain   [name] is a 38 y.o. female.    Abdominal Pain    Patient has history of endometriosis, acid reflux, hypertension, chronic abdominal pain, cocaine use, polysubstance abuse.  Patient presents ED with complaints of abdominal pain.  Patient states she started having abdominal pain vomiting diarrhea that started last night.  Patient had previously been on Suboxone but she stopped taking that about a week ago.  She has been trying an herbal supplement kratom.  Patient states the pain is severe throughout her entire abdomen.  She has been vomiting.  Prior to Admission medications  Medication Sig Start Date End Date Taking? Authorizing Provider  dicyclomine  (BENTYL ) 20 MG tablet Take 1 tablet (20 mg total) by mouth 2 (two) times daily. 07/09/24  Yes Randol Simmonds, MD  ondansetron  (ZOFRAN -ODT) 8 MG disintegrating tablet Take 1 tablet (8 mg total) by mouth every 8 (eight) hours as needed for nausea or vomiting. 07/09/24  Yes Randol Simmonds, MD  albuterol  (PROVENTIL ) (2.5 MG/3ML) 0.083% nebulizer solution Take 3 mLs (2.5 mg total) by nebulization every 6 (six) hours as needed for wheezing or shortness of breath. 07/12/23   Rodriguez-Southworth, Sylvia, PA-C  cyclobenzaprine  (FLEXERIL ) 10 MG tablet Take 1 tablet (10 mg total) by mouth 2 (two) times daily as needed for muscle spasms. 06/14/24   Nivia Colon, PA-C  ibuprofen  (ADVIL ) 600 MG tablet Take 1 tablet (600 mg total) by mouth every 6 (six) hours as needed. 06/14/24   Nivia Colon, PA-C  naloxone  (NARCAN ) nasal spray 4 mg/0.1 mL For accidental overdose 09/02/23   Carita Senior, MD  ondansetron  (ZOFRAN ) 4 MG tablet Take 1 tablet (4 mg total) by mouth every 6 (six) hours. 12/08/20   Emelia Sluder, PA-C  pantoprazole  (PROTONIX ) 40 MG tablet Take 1 tablet (40 mg total) by mouth 2 (two)  times daily before a meal. Protonix  40mg  BID for 2 months, then once daily as per GI recommendations 12/10/20 01/09/21  Cheryle Page, MD  predniSONE  (DELTASONE ) 20 MG tablet 3 tabs po day one, then 2 tabs daily x 4 days 06/14/24   Nivia Colon, PA-C    Allergies: Contrast media [iodinated contrast media], Gabapentin , Reglan [metoclopramide], and Latex    Review of Systems  Gastrointestinal:  Positive for abdominal pain.    Updated Vital Signs BP (!) 128/95   Pulse 92   Temp 98.7 F (37.1 C) (Oral)   Resp 16   SpO2 100%   Physical Exam Vitals and nursing note reviewed.  Constitutional:      Appearance: She is well-developed. She is ill-appearing.  HENT:     Head: Normocephalic and atraumatic.     Right Ear: External ear normal.     Left Ear: External ear normal.  Eyes:     General: No scleral icterus.       Right eye: No discharge.        Left eye: No discharge.     Conjunctiva/sclera: Conjunctivae normal.  Neck:     Trachea: No tracheal deviation.  Cardiovascular:     Rate and Rhythm: Normal rate and regular rhythm.  Pulmonary:     Effort: Pulmonary effort is normal. No respiratory distress.     Breath sounds: Normal breath sounds. No stridor. No wheezing or rales.  Abdominal:     General: Bowel sounds are normal.  There is no distension.     Palpations: Abdomen is soft.     Tenderness: There is generalized abdominal tenderness. There is guarding. There is no rebound.  Musculoskeletal:        General: No tenderness or deformity.     Cervical back: Neck supple.  Skin:    General: Skin is warm and dry.     Findings: No rash.  Neurological:     General: No focal deficit present.     Mental Status: She is alert.     Cranial Nerves: No cranial nerve deficit, dysarthria or facial asymmetry.     Sensory: No sensory deficit.     Motor: No abnormal muscle tone or seizure activity.     Coordination: Coordination normal.  Psychiatric:        Mood and Affect: Mood normal.      (all labs ordered are listed, but only abnormal results are displayed) Labs Reviewed  COMPREHENSIVE METABOLIC PANEL WITH GFR - Abnormal; Notable for the following components:      Result Value   Glucose, Bld 111 (*)    All other components within normal limits  LIPASE, BLOOD  CBC WITH DIFFERENTIAL/PLATELET  HCG, SERUM, QUALITATIVE  URINALYSIS, ROUTINE W REFLEX MICROSCOPIC  URINE DRUG SCREEN    EKG: None  Radiology: CT ABDOMEN PELVIS WO CONTRAST Result Date: 07/09/2024 CLINICAL DATA:  Abdominal pain, acute, nonlocalized. EXAM: CT ABDOMEN AND PELVIS WITHOUT CONTRAST TECHNIQUE: Multidetector CT imaging of the abdomen and pelvis was performed following the standard protocol without IV contrast. RADIATION DOSE REDUCTION: This exam was performed according to the departmental dose-optimization program which includes automated exposure control, adjustment of the mA and/or kV according to patient size and/or use of iterative reconstruction technique. COMPARISON:  CT scan abdomen and pelvis from 12/08/2020. FINDINGS: Lower chest: The lung bases are clear. No pleural effusion. The heart is normal in size. No pericardial effusion. Hepatobiliary: The liver is normal in size. Non-cirrhotic configuration. There is a geographic, small, hypoattenuating area along the falciform ligament attachment site, which even though incompletely characterized on the current examination, favored to represent focal fatty infiltration. No suspicious liver lesion. No intrahepatic or extrahepatic bile duct dilation. No calcified gallstones. Normal gallbladder wall thickness. No pericholecystic inflammatory changes. Pancreas: Unremarkable. No pancreatic ductal dilatation or surrounding inflammatory changes. Spleen: Within normal limits. No focal lesion. Adrenals/Urinary Tract: Adrenal glands are unremarkable. No suspicious renal mass. No hydronephrosis. No renal or ureteric calculi. Unremarkable urinary bladder.  Stomach/Bowel: No disproportionate dilation of the small or large bowel loops. No evidence of abnormal bowel wall thickening or inflammatory changes. The appendix is unremarkable. Vascular/Lymphatic: No ascites or pneumoperitoneum. No abdominal or pelvic lymphadenopathy, by size criteria. No aneurysmal dilation of the major abdominal arteries. There are mild peripheral atherosclerotic vascular calcifications of the aorta and its major branches. Reproductive: The uterus is surgically absent. No large adnexal mass. Other: The visualized soft tissues and abdominal wall are unremarkable. Musculoskeletal: No suspicious osseous lesions. There are mild multilevel degenerative changes in the visualized spine. 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 IMPRESSION: 1. No acute inflammatory process identified within the abdomen or pelvis. No nephroureterolithiasis or obstructive uropathy. 2. Multiple other nonacute observations, as described above. Aortic Atherosclerosis (ICD10-I70.0). Electronically Signed   By: Ree Molt M.D.   On: 07/09/2024 13:55     Procedures   Medications Ordered in the ED  ketorolac  (TORADOL ) 15 MG/ML injection 15 mg (15 mg Intravenous Given 07/09/24  0840)  ondansetron  (ZOFRAN ) injection 4 mg (4 mg Intravenous Given 07/09/24 0840)  lactated ringers  bolus 1,000 mL ( Intravenous Stopped 07/09/24 1005)  LORazepam (ATIVAN) injection 1 mg (1 mg Intravenous Given 07/09/24 0838)  ondansetron  (ZOFRAN ) injection 4 mg (4 mg Intravenous Given 07/09/24 1157)  prochlorperazine (COMPAZINE) tablet 10 mg (10 mg Oral Given 07/09/24 1431)    Clinical Course as of 07/09/24 1433  Wed Jul 09, 2024  0910 CBC with Diff nl [JK]  0910 hCG, serum, qualitative nl [JK]  1038 Lipase, blood Lipase normal.  Pregnancy test negative.  Metabolic panel normal [JK]    Clinical Course User Index [JK] Randol Simmonds, MD                                 Medical  Decision Making Amount and/or Complexity of Data Reviewed Labs: ordered. Decision-making details documented in ED Course. Radiology: ordered.  Risk Prescription drug management.   Patient presented with complaints of abdominal pain vomiting.  ED workup reassuring.  No elevation white blood cell count.  No anemia.  No signs of hepatitis or pancreatitis.  Patient is not pregnant.  Patient wasCT scan was performed and does not show any acute abnormality.  Patient was treated with IV fluids and antiemetics.  Will discharge home with medications for nausea and abdominal cramping.  Evaluation and diagnostic testing in the emergency department does not suggest an emergent condition requiring admission or immediate intervention beyond what has been performed at this time.  The patient is safe for discharge and has been instructed to return immediately for worsening symptoms, change in symptoms or any other concerns.     Final diagnoses:  Abdominal pain, unspecified abdominal location  Nausea and vomiting, unspecified vomiting type    ED Discharge Orders          Ordered    ondansetron  (ZOFRAN -ODT) 8 MG disintegrating tablet  Every 8 hours PRN        07/09/24 1431    dicyclomine  (BENTYL ) 20 MG tablet  2 times daily        07/09/24 1431               Randol Simmonds, MD 07/09/24 1433  "

## 2024-07-09 NOTE — ED Triage Notes (Signed)
 BIB [facility]  C/o abd pain w/ n/v/d since last night. Reports stopped taking suboxone and taking kratom instead.   Patient crying during triage.

## 2024-07-09 NOTE — Discharge Instructions (Signed)
 The laboratory tests and CT scan fortunately did not show any signs of serious abnormality.  Drink plenty of fluids.  Rest.  Have a bland diet including broths, crackers  for the next few days.  Take the medications to help with nausea vomiting and abdominal cramping.  Follow-up with your primary care doctor to be rechecked if your symptoms do not resolve over the next few days

## 2024-07-09 NOTE — ED Notes (Signed)
 Pt ran to restroom stating I have to poop
